# Patient Record
Sex: Female | Born: 1975 | Race: White | Hispanic: No | Marital: Single | Smoking: Never smoker
Health system: Southern US, Community
[De-identification: ages and names within clinical notes are randomized; demographics above are authoritative.]

## PROBLEM LIST (undated history)

## (undated) DIAGNOSIS — D649 Anemia, unspecified: Secondary | ICD-10-CM

## (undated) DIAGNOSIS — M009 Pyogenic arthritis, unspecified: Secondary | ICD-10-CM

## (undated) DIAGNOSIS — I4891 Unspecified atrial fibrillation: Secondary | ICD-10-CM

## (undated) DIAGNOSIS — F32A Depression, unspecified: Secondary | ICD-10-CM

## (undated) DIAGNOSIS — G4700 Insomnia, unspecified: Secondary | ICD-10-CM

## (undated) DIAGNOSIS — F4310 Post-traumatic stress disorder, unspecified: Secondary | ICD-10-CM

## (undated) DIAGNOSIS — F419 Anxiety disorder, unspecified: Secondary | ICD-10-CM

## (undated) DIAGNOSIS — I38 Endocarditis, valve unspecified: Secondary | ICD-10-CM

## (undated) DIAGNOSIS — R011 Cardiac murmur, unspecified: Secondary | ICD-10-CM

## (undated) DIAGNOSIS — I76 Septic arterial embolism: Secondary | ICD-10-CM

## (undated) DIAGNOSIS — N83209 Unspecified ovarian cyst, unspecified side: Secondary | ICD-10-CM

## (undated) DIAGNOSIS — I2699 Other pulmonary embolism without acute cor pulmonale: Secondary | ICD-10-CM

## (undated) DIAGNOSIS — R87629 Unspecified abnormal cytological findings in specimens from vagina: Secondary | ICD-10-CM

## (undated) DIAGNOSIS — F99 Mental disorder, not otherwise specified: Secondary | ICD-10-CM

## (undated) DIAGNOSIS — I471 Supraventricular tachycardia, unspecified: Secondary | ICD-10-CM

## (undated) DIAGNOSIS — A419 Sepsis, unspecified organism: Secondary | ICD-10-CM

## (undated) DIAGNOSIS — F1010 Alcohol abuse, uncomplicated: Secondary | ICD-10-CM

## (undated) DIAGNOSIS — J189 Pneumonia, unspecified organism: Secondary | ICD-10-CM

## (undated) DIAGNOSIS — I079 Rheumatic tricuspid valve disease, unspecified: Secondary | ICD-10-CM

## (undated) DIAGNOSIS — N649 Disorder of breast, unspecified: Secondary | ICD-10-CM

## (undated) HISTORY — DX: Anxiety disorder, unspecified: F41.9

## (undated) HISTORY — DX: Supraventricular tachycardia, unspecified: I47.10

## (undated) HISTORY — DX: Sepsis, unspecified organism: A41.9

## (undated) HISTORY — PX: CARDIAC ELECTROPHYSIOLOGY MAPPING AND ABLATION: SHX1292

## (undated) HISTORY — PX: CHOLECYSTECTOMY: SHX55

## (undated) HISTORY — PX: TOTAL HIP ARTHROPLASTY: SHX124

## (undated) HISTORY — DX: Unspecified ovarian cyst, unspecified side: N83.209

## (undated) HISTORY — PX: TRICUSPID VALVE REPLACEMENT: SHX816

## (undated) HISTORY — DX: Post-traumatic stress disorder, unspecified: F43.10

## (undated) HISTORY — DX: Mental disorder, not otherwise specified: F99

## (undated) HISTORY — DX: Supraventricular tachycardia: I47.1

## (undated) HISTORY — PX: CARDIAC ELECTROPHYSIOLOGY STUDY AND ABLATION: SHX1294

## (undated) HISTORY — PX: CARPAL TUNNEL RELEASE: SHX101

## (undated) HISTORY — PX: CARDIOVERSION: SHX1299

## (undated) HISTORY — DX: Anemia, unspecified: D64.9

## (undated) HISTORY — DX: Endocarditis, valve unspecified: I38

## (undated) HISTORY — DX: Unspecified abnormal cytological findings in specimens from vagina: R87.629

## (undated) HISTORY — DX: Disorder of breast, unspecified: N64.9

## (undated) HISTORY — PX: HIP SURGERY: SHX245

## (undated) HISTORY — DX: Rheumatic tricuspid valve disease, unspecified: I07.9

## (undated) HISTORY — DX: Depression, unspecified: F32.A

## (undated) HISTORY — DX: Insomnia, unspecified: G47.00

## (undated) HISTORY — DX: Unspecified atrial fibrillation: I48.91

## (undated) HISTORY — PX: THORACENTESIS: SHX235

---

## 1998-04-20 ENCOUNTER — Emergency Department (HOSPITAL_COMMUNITY): Admission: EM | Admit: 1998-04-20 | Discharge: 1998-04-20 | Payer: Self-pay | Admitting: Emergency Medicine

## 1998-09-22 ENCOUNTER — Emergency Department (HOSPITAL_COMMUNITY): Admission: EM | Admit: 1998-09-22 | Discharge: 1998-09-22 | Payer: Self-pay

## 2013-09-26 DIAGNOSIS — D649 Anemia, unspecified: Secondary | ICD-10-CM | POA: Insufficient documentation

## 2016-12-09 ENCOUNTER — Encounter: Payer: Self-pay | Admitting: Obstetrics & Gynecology

## 2016-12-23 ENCOUNTER — Encounter: Payer: Self-pay | Admitting: Obstetrics & Gynecology

## 2017-01-08 ENCOUNTER — Encounter: Payer: Self-pay | Admitting: Obstetrics & Gynecology

## 2017-01-08 ENCOUNTER — Ambulatory Visit (INDEPENDENT_AMBULATORY_CARE_PROVIDER_SITE_OTHER): Payer: Commercial Managed Care - PPO | Admitting: Obstetrics & Gynecology

## 2017-01-08 VITALS — BP 121/86 | HR 91 | Ht 68.0 in | Wt 221.0 lb

## 2017-01-08 DIAGNOSIS — N920 Excessive and frequent menstruation with regular cycle: Secondary | ICD-10-CM

## 2017-01-08 DIAGNOSIS — Z01419 Encounter for gynecological examination (general) (routine) without abnormal findings: Secondary | ICD-10-CM

## 2017-01-08 MED ORDER — NORGESTREL-ETHINYL ESTRADIOL 0.3-30 MG-MCG PO TABS: 1.0000 | ORAL_TABLET | Freq: Every day | ORAL | 11 refills | Status: DC

## 2017-01-08 NOTE — Progress Notes (Signed)
Subjective:    Judy Welch is a 41 yo S W P0 here today with the issues of very painful periods, very heavy periods, lasting 7 days. She takes Aleve and flexeril with help sometimes. This has been present for years but worse in the last year. She has taken OCPs for this in the past which helped the issue.   The patient is not currently sexually active. GYN screening history: last pap: was normal. The patient wears seatbelts: yes. The patient participates in regular exercise: not asked. Has the patient ever been transfused or tattooed?: yes. The patient reports that there is not domestic violence in her life.   Menstrual History: OB History    Gravida Para Term Preterm AB Living   0 0 0 0 0 0   SAB TAB Ectopic Multiple Live Births   0 0 0 0 0      Menarche age: 83 No LMP recorded. Period Cycle (Days): 28 Period Duration (Days): 3-4 days Period Pattern: (!) Irregular Menstrual Flow: Moderate, Heavy Menstrual Control: Maxi pad Dysmenorrhea: (!) Moderate Dysmenorrhea Symptoms: Cramping, Headache, Throbbing, Other (Comment)  The following portions of the patient's history were reviewed and updated as appropriate: allergies, current medications, past family history, past medical history, past social history, past surgical history and problem list.  Review of Systems Pertinent items are noted in HPI.    Objective:    BP 121/86 (BP Location: Right Arm, Patient Position: Sitting)   Pulse 91   Ht '5\' 8"'$  (1.727 m)   Wt 221 lb (100.2 kg)   BMI 33.60 kg/m   General Appearance:    Alert, cooperative, no distress, appears stated age  Head:    Normocephalic, without obvious abnormality, atraumatic  Eyes:    PERRL, conjunctiva/corneas clear, EOM's intact, fundi    benign, both eyes  Ears:    Normal TM's and external ear canals, both ears  Nose:   Nares normal, septum midline, mucosa normal, no drainage    or sinus tenderness  Throat:   Lips, mucosa, and tongue normal; teeth and gums normal   Neck:   Supple, symmetrical, trachea midline, no adenopathy;    thyroid:  no enlargement/tenderness/nodules; no carotid   bruit or JVD  Back:     Symmetric, no curvature, ROM normal, no CVA tenderness  Lungs:     Clear to auscultation bilaterally, respirations unlabored  Chest Wall:    No tenderness or deformity   Heart:    Regular rate and rhythm, S1 and S2 normal, no murmur, rub   or gallop  Breast Exam:    No tenderness, masses, or nipple abnormality  Abdomen:     Soft, non-tender, bowel sounds active all four quadrants,    no masses, no organomegaly  Genitalia:    Normal female without lesion, discharge or tenderness, NSSA, NT, mobile, nulliparous cervix, stenotic, NSSA, NT, no palpable adnexal masses     Extremities:   Extremities normal, atraumatic, no cyanosis or edema  Pulses:   2+ and symmetric all extremities  Skin:   Skin color, texture, turgor normal, no rashes or lesions  Lymph nodes:   Cervical, supraclavicular, and axillary nodes normal  Neurologic:   CNII-XII intact, normal strength, sensation and reflexes    throughout  .    Assessment:    Healthy female exam.    Plan:    + FH of breast cancer- check BRCA testing Heavy,painful periods- cbc, tsh, gyn u/s Preventative care- pap smear with cotesting today Start  lo ovral with NMP RTC 3 weeks

## 2017-01-08 NOTE — Progress Notes (Signed)
   Subjective:    Patient ID: Judy Welch, female    DOB: 02-11-1976, 41 y.o.   MRN: [id number]  HPI 41 yo S W P0 here today with the issues of very painful periods, very heavy periods, lasting 7 days. She takes Aleve and flexeril with help sometimes. This has been present for years but worse in the last year. She has taken OCPs for this in the past which helped the issue.     Review of Systems Her last pap was about 14 years ago She is a Marine scientist at [employer], previously in [place]. Mammogram last month FH- + breast-p cousin and p aunt, p great aunt + cervical cancer - mgreat aunt' + bladder cancer - mom    Objective:   Physical Exam        Assessment & Plan:  + FH of breast cancer- check BRCA testing Heavy,painful periods- cbc, tsh, gyn u/s Preventative care- pap smear with cotesting today Start lo ovral with NMP RTC 3 weeks

## 2017-01-09 ENCOUNTER — Ambulatory Visit (HOSPITAL_BASED_OUTPATIENT_CLINIC_OR_DEPARTMENT_OTHER)
Admission: RE | Admit: 2017-01-09 | Discharge: 2017-01-09 | Disposition: A | Discharge status: Home / Self Care | Payer: Commercial Managed Care - PPO | Source: Ambulatory Visit | Attending: Obstetrics & Gynecology | Admitting: Obstetrics & Gynecology

## 2017-01-09 ENCOUNTER — Encounter (HOSPITAL_BASED_OUTPATIENT_CLINIC_OR_DEPARTMENT_OTHER): Payer: Self-pay | Admitting: Radiology

## 2017-01-09 DIAGNOSIS — N920 Excessive and frequent menstruation with regular cycle: Secondary | ICD-10-CM

## 2017-01-12 LAB — CYTOLOGY - PAP
DIAGNOSIS: NEGATIVE
HPV: NOT DETECTED

## 2017-01-26 ENCOUNTER — Encounter: Payer: Self-pay | Admitting: *Deleted

## 2017-01-26 ENCOUNTER — Telehealth: Payer: Self-pay | Admitting: *Deleted

## 2017-01-26 NOTE — Telephone Encounter (Signed)
Pt notified of [facility] My Risk report.  Copy of report mailed to pt and pt will call the genetic counselor to discuss the report in detail.  Pt also notified of pap and U/S report.  Dr Hulan Fray reviewed the My Risk results and agreed that pt needs to talk with the genetic counselor.

## 2017-04-29 DIAGNOSIS — G621 Alcoholic polyneuropathy: Secondary | ICD-10-CM | POA: Insufficient documentation

## 2017-07-09 ENCOUNTER — Encounter: Payer: Self-pay | Admitting: Obstetrics & Gynecology

## 2017-07-09 ENCOUNTER — Ambulatory Visit (INDEPENDENT_AMBULATORY_CARE_PROVIDER_SITE_OTHER): Payer: PRIVATE HEALTH INSURANCE | Admitting: Obstetrics & Gynecology

## 2017-07-09 VITALS — BP 112/79 | HR 79 | Ht 68.0 in | Wt 234.0 lb

## 2017-07-09 DIAGNOSIS — R339 Retention of urine, unspecified: Secondary | ICD-10-CM | POA: Diagnosis not present

## 2017-07-09 NOTE — Progress Notes (Signed)
Patient ID: Judy Welch, female   DOB: 10/16/1975, 41 y.o.   MRN: [id number]  No chief complaint on file.   HPI Judy Welch is a 41 y.o. female. P0 here with new onset feeling of some pressure in her vagina. She was worried that her uterus had dropped. She denies GSUI and has some urinary incontinence. HPI  Past Medical History:  Diagnosis Date  . Anemia   . Breast disorder    left breast lump  . Mental disorder    anxiety  . Ovarian cyst   . SVT (supraventricular tachycardia) ([place])   . Vaginal Pap smear, abnormal     Past Surgical History:  Procedure Laterality Date  . CARDIAC ELECTROPHYSIOLOGY STUDY AND ABLATION    . CARDIOVERSION      Family History  Problem Relation Age of Onset  . Diabetes Father   . Hypertension Father   . Cancer Mother        bladder  . Cancer Maternal Aunt        breast  . Stroke Neg Hx     Social History Social History   Tobacco Use  . Smoking status: Never Smoker  . Smokeless tobacco: Never Used  Substance Use Topics  . Alcohol use: No  . Drug use: No    No Known Allergies  Current Outpatient Medications  Medication Sig Dispense Refill  . acamprosate (CAMPRAL) 333 MG tablet Take 666 mg by mouth.    . cyclobenzaprine (FLEXERIL) 10 MG tablet Take by mouth.    . hydrOXYzine (VISTARIL) 100 MG capsule Take by mouth.    . norgestrel-ethinyl estradiol (LO/OVRAL,CRYSELLE) 0.3-30 MG-MCG tablet Take 1 tablet by mouth daily. 1 Package 11  . potassium chloride 20 MEQ/15ML (10%) SOLN Take by mouth.    . propranolol (INDERAL) 10 MG tablet Take by mouth.    . sertraline (ZOLOFT) 100 MG tablet Take by mouth.    . clonazePAM (KLONOPIN) 0.5 MG tablet Take by mouth.    . traZODone (DESYREL) 50 MG tablet Take by mouth.     No current facility-administered medications for this visit.     Review of Systems Review of Systems She is a Marine scientist at [employer] Blood pressure 112/79, pulse 79, height 5\' 8"  (1.727 m), weight 234 lb (106.1 kg), last  menstrual period 05/28/2017.  Physical Exam Physical Exam Well nourished, well hydrated White female, no apparent distress Breathing, conversing, and ambulating normally Abd- benign Grade "1/2" cystocele. NO uterine prolapse, NO rectocele  Data Reviewed FINDINGS: Uterus  Measurements: 7.9 x 4.2 x 4.7 cm. No fibroids or other mass visualized. Status:  Edited Result - FINAL Visible to patient:  No (Not Released) Next appt:  None Dx:  Well woman exam with routine gynecolo...  Component 38mo ago  Adequacy Satisfactory for evaluation endocervical/transformation zone component PRESENT.   Diagnosis NEGATIVE FOR INTRAEPITHELIAL LESIONS OR MALIGNANCY.   HPV NOT DETECTED   Comment: Normal Reference Range -          Assessment    Urinary retention    Plan   Refer to urology        Emily Filbert 07/09/2017, 3:39 PM

## 2017-08-19 DIAGNOSIS — M533 Sacrococcygeal disorders, not elsewhere classified: Secondary | ICD-10-CM | POA: Insufficient documentation

## 2018-02-23 ENCOUNTER — Other Ambulatory Visit: Payer: Self-pay | Admitting: Obstetrics & Gynecology

## 2018-03-08 DIAGNOSIS — G894 Chronic pain syndrome: Secondary | ICD-10-CM | POA: Insufficient documentation

## 2018-04-09 IMAGING — US US PELVIS COMPLETE
1 series · 13 of 25 positions shown · non-contrast
Comparison: None

CLINICAL DATA: Patient with history of menorrhagia. Bilateral
pelvic pain.



[Series 1: us pelvis complete · 0.20mm/px · 13 of 82 slices shown]
[im 1/82]
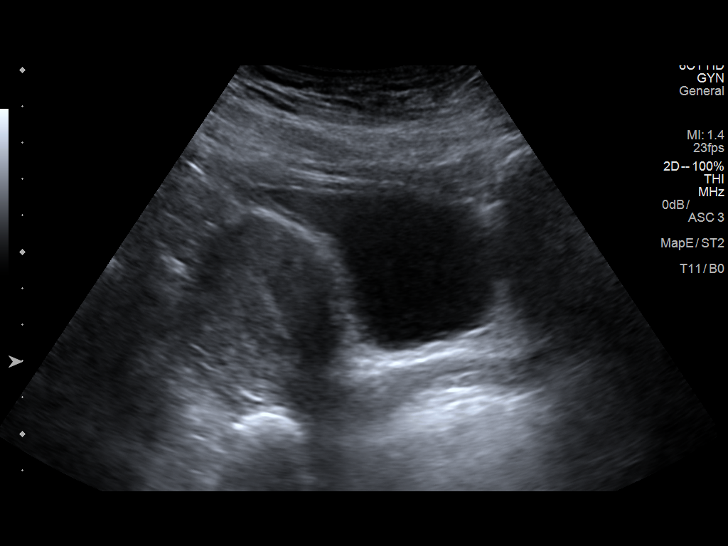
[im 7/82]
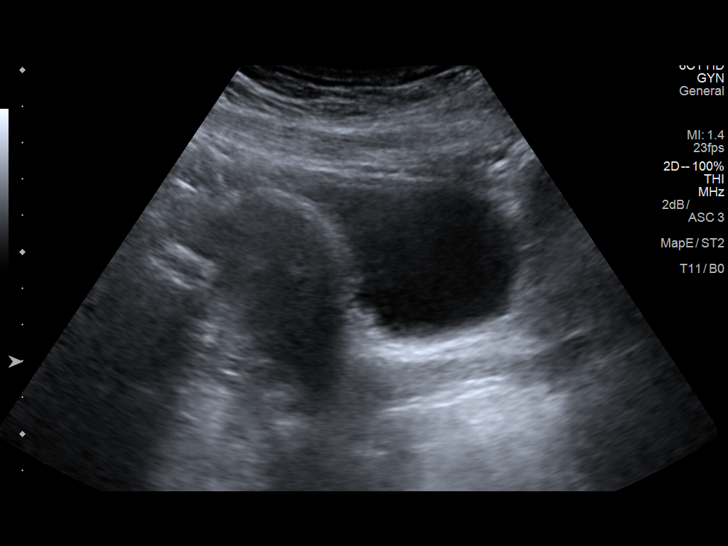
[im 14/82]
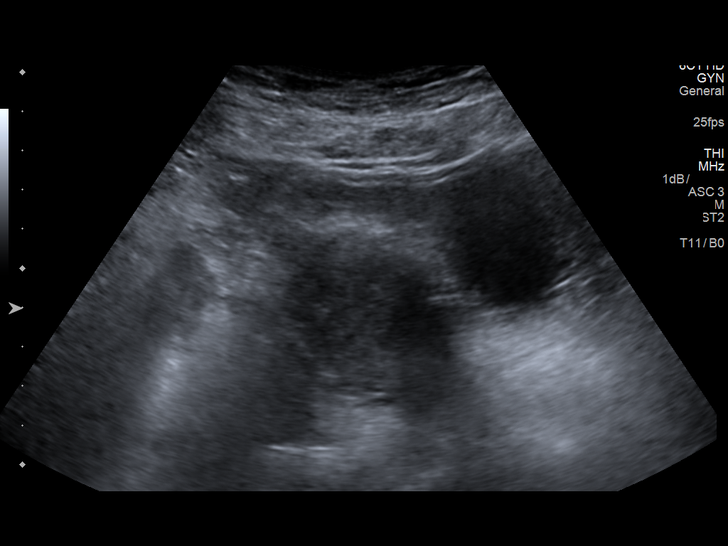
[im 21/82]
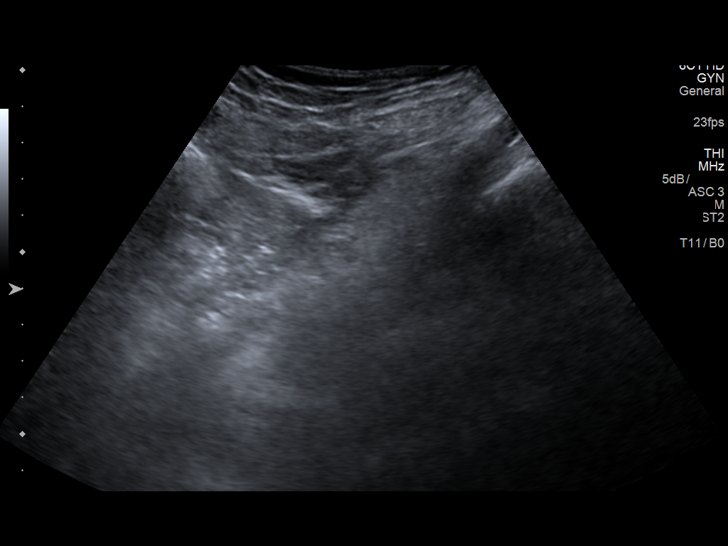
[im 28/82]
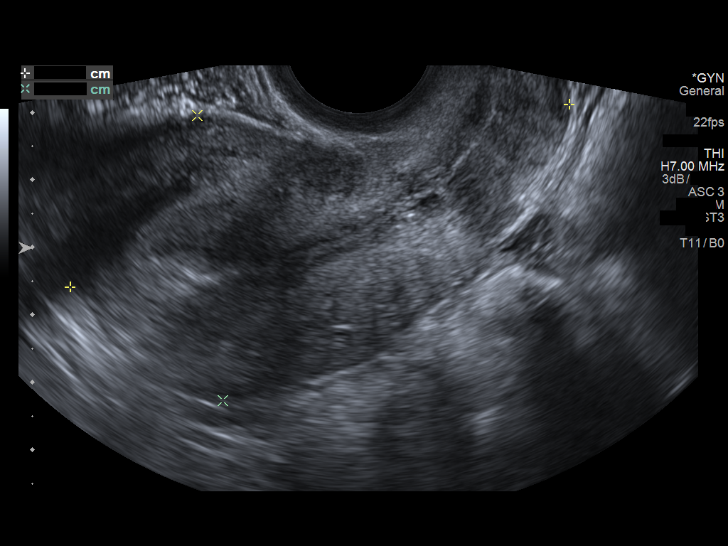
[im 34/82]
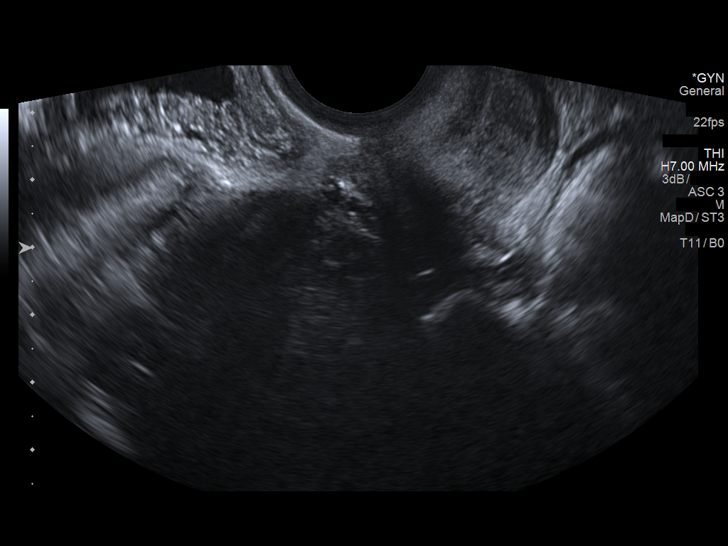
[im 41/82]
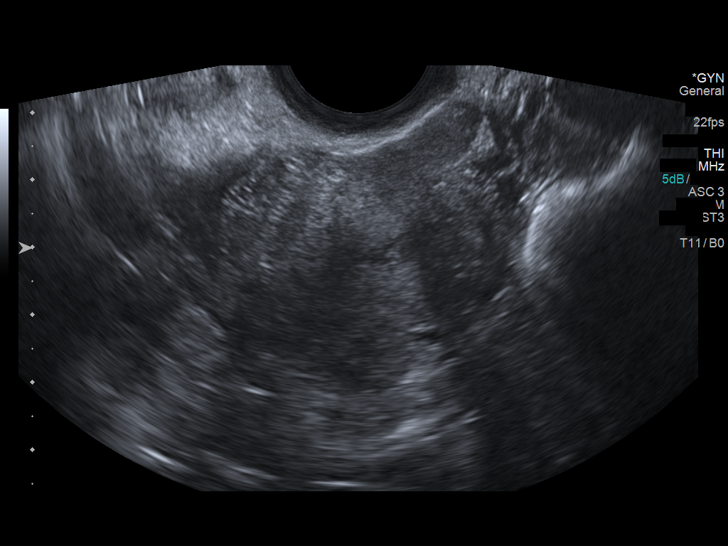
[im 48/82]
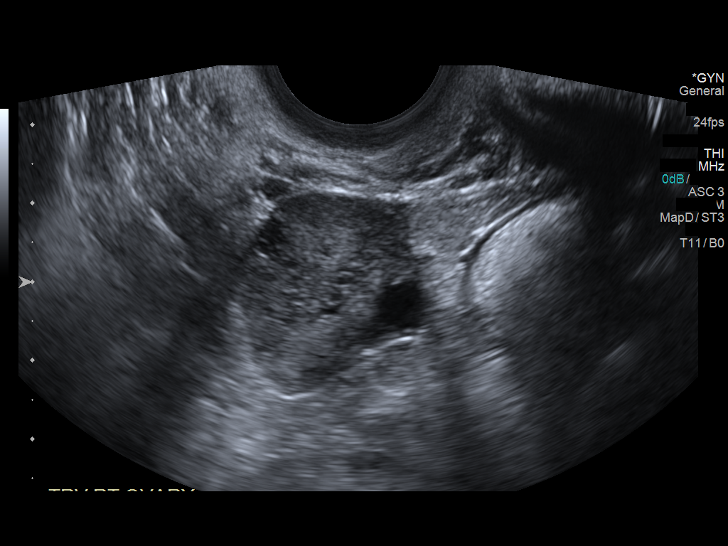
[im 55/82]
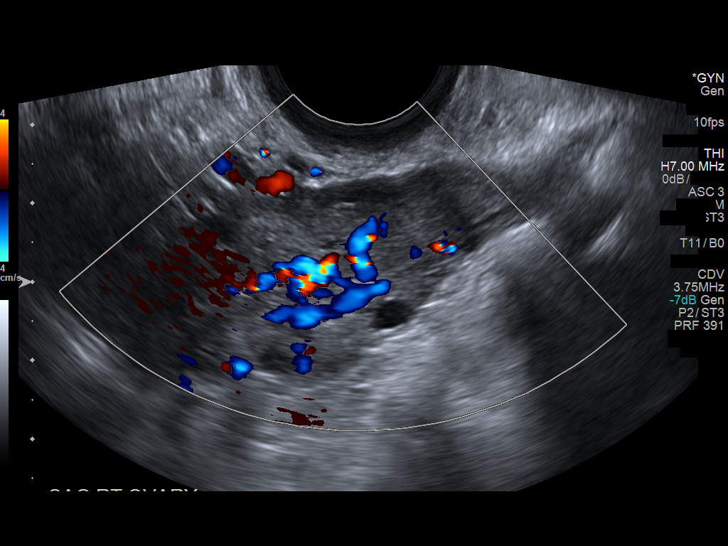
[im 61/82]
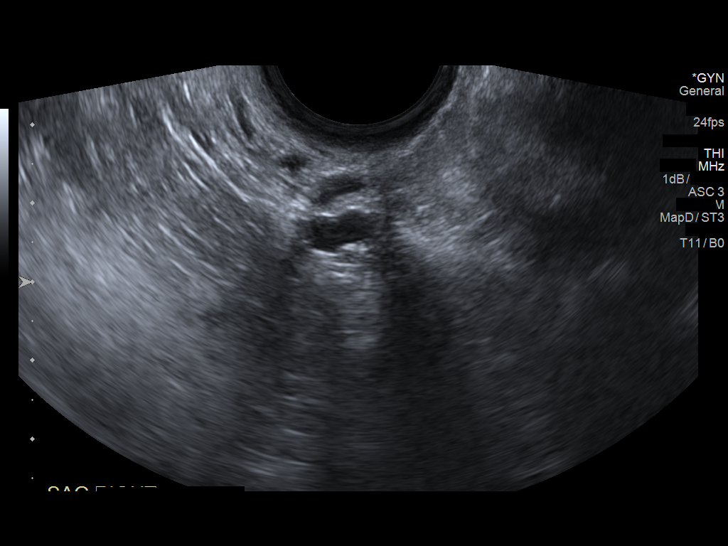
[im 68/82]
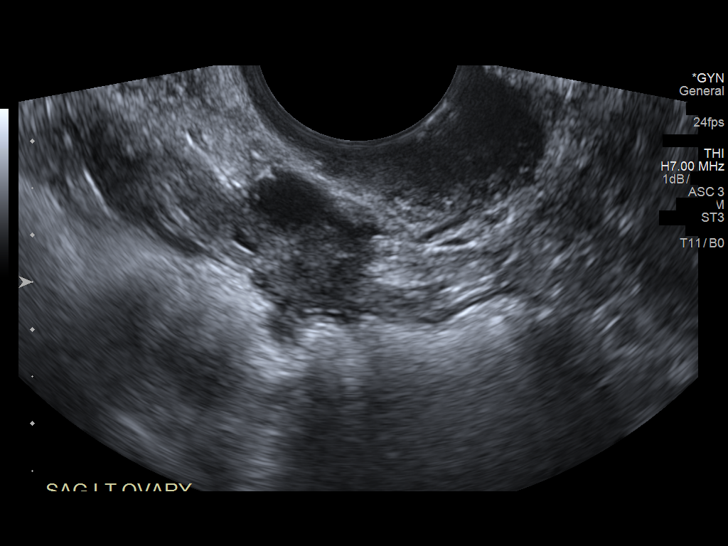
[im 75/82]
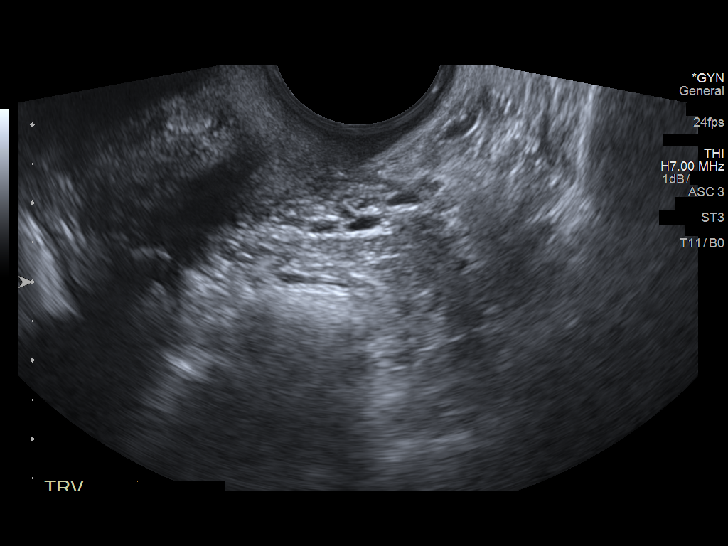
[im 82/82]
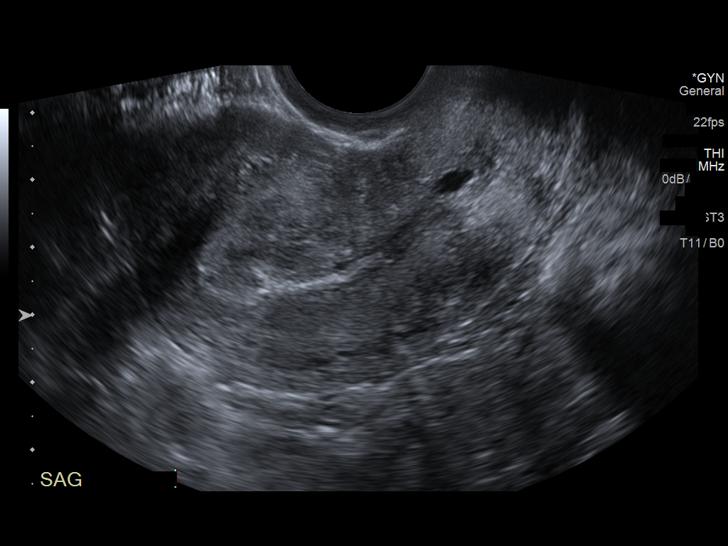

[13 of 25 positions shown; findings below may reference images not displayed]

FINDINGS: Uterus

Measurements: 7.9 x 4.2 x 4.7 cm. No fibroids or other mass
visualized.

Endometrium

Thickness: 13 mm.  No focal abnormality visualized.

Right ovary

Measurements: 3.4 x 2.4 x 2.4 cm. Normal appearance/no adnexal mass.

Left ovary

Measurements: 3.0 x 1.7 x 1.9 cm. Normal appearance/no adnexal mass.

Other findings

No abnormal free fluid.
IMPRESSION: Endometrium measures 13 mm. If bleeding remains unresponsive to
hormonal or medical therapy, sonohysterogram should be considered
for focal lesion work-up. (Ref: Radiological Reasoning: Algorithmic
Workup of Abnormal Vaginal Bleeding with Endovaginal Sonography and
Sonohysterography. AJR [id number]; 191:[id number])

## 2018-08-03 ENCOUNTER — Other Ambulatory Visit: Payer: Self-pay | Admitting: Obstetrics & Gynecology

## 2018-08-04 ENCOUNTER — Other Ambulatory Visit: Payer: Self-pay | Admitting: Obstetrics & Gynecology

## 2018-09-14 ENCOUNTER — Ambulatory Visit: Payer: PRIVATE HEALTH INSURANCE | Admitting: Psychiatry

## 2019-04-27 ENCOUNTER — Telehealth (HOSPITAL_COMMUNITY): Payer: Self-pay

## 2019-04-27 NOTE — Telephone Encounter (Signed)
Telephoned patient at home number.  Left message to call [facility] and schedule appointment.

## 2019-06-07 DIAGNOSIS — I480 Paroxysmal atrial fibrillation: Secondary | ICD-10-CM | POA: Insufficient documentation

## 2019-06-07 DIAGNOSIS — I2699 Other pulmonary embolism without acute cor pulmonale: Secondary | ICD-10-CM | POA: Insufficient documentation

## 2019-06-08 DIAGNOSIS — D509 Iron deficiency anemia, unspecified: Secondary | ICD-10-CM | POA: Insufficient documentation

## 2019-06-08 DIAGNOSIS — E876 Hypokalemia: Secondary | ICD-10-CM | POA: Insufficient documentation

## 2019-07-02 DIAGNOSIS — Z87898 Personal history of other specified conditions: Secondary | ICD-10-CM | POA: Insufficient documentation

## 2019-08-08 DIAGNOSIS — I079 Rheumatic tricuspid valve disease, unspecified: Secondary | ICD-10-CM | POA: Insufficient documentation

## 2019-10-04 DIAGNOSIS — Z96649 Presence of unspecified artificial hip joint: Secondary | ICD-10-CM | POA: Insufficient documentation

## 2020-05-03 DIAGNOSIS — M1611 Unilateral primary osteoarthritis, right hip: Secondary | ICD-10-CM | POA: Insufficient documentation

## 2020-11-13 DIAGNOSIS — F5101 Primary insomnia: Secondary | ICD-10-CM | POA: Insufficient documentation

## 2020-11-13 DIAGNOSIS — F331 Major depressive disorder, recurrent, moderate: Secondary | ICD-10-CM | POA: Insufficient documentation

## 2020-11-13 DIAGNOSIS — F410 Panic disorder [episodic paroxysmal anxiety] without agoraphobia: Secondary | ICD-10-CM | POA: Insufficient documentation

## 2020-11-14 ENCOUNTER — Encounter: Payer: Self-pay | Admitting: Obstetrics and Gynecology

## 2020-11-14 ENCOUNTER — Other Ambulatory Visit: Payer: Self-pay

## 2020-11-14 ENCOUNTER — Other Ambulatory Visit (HOSPITAL_COMMUNITY)
Admission: RE | Admit: 2020-11-14 | Discharge: 2020-11-14 | Disposition: A | Discharge status: Home / Self Care | Payer: Medicaid Other | Source: Ambulatory Visit | Attending: Obstetrics and Gynecology | Admitting: Obstetrics and Gynecology

## 2020-11-14 ENCOUNTER — Ambulatory Visit (INDEPENDENT_AMBULATORY_CARE_PROVIDER_SITE_OTHER): Payer: Medicaid Other | Admitting: Obstetrics and Gynecology

## 2020-11-14 VITALS — BP 132/88 | HR 93 | Resp 16 | Ht 68.0 in | Wt 214.0 lb

## 2020-11-14 DIAGNOSIS — N939 Abnormal uterine and vaginal bleeding, unspecified: Secondary | ICD-10-CM | POA: Diagnosis not present

## 2020-11-14 DIAGNOSIS — Z01419 Encounter for gynecological examination (general) (routine) without abnormal findings: Secondary | ICD-10-CM

## 2020-11-14 NOTE — Progress Notes (Signed)
GYNECOLOGY ANNUAL PREVENTATIVE CARE ENCOUNTER NOTE  History:     Judy Welch is a 45 y.o. G0P0000 female here for a routine annual gynecologic exam.  Current complaints: Heavy, painful periods. Last [place] was 2018 which showed a mildly thickened endometrium. .   Denies abnormal vaginal bleeding, discharge, pelvic pain, problems with intercourse or other gynecologic concerns.   Spent 2 months in the hospital with Sepsis. Hx of PE on xerolto, has terribly heavy/painful periods for many of years; worse now that she is on [place]. Hx of endocarditis and Afib. Periods are a disruption to her life. They are regular however painful/heavy.   Gynecologic History Patient's last menstrual period was 10/24/2020. Contraception: none Last Pap: 2018. Results were: normal with negative HPV Last mammogram: NA  Obstetric History OB History  Gravida Para Term Preterm AB Living  0 0 0 0 0 0  SAB IAB Ectopic Multiple Live Births  0 0 0 0 0    Past Medical History:  Diagnosis Date  . Anemia   . Anxiety   . Breast disorder    left breast lump  . Depression   . Endocarditis   . Fibrillation, atrial ([place])   . Insomnia disorder   . Mental disorder    anxiety  . Ovarian cyst   . PTSD (post-traumatic stress disorder)   . Septic shock ([place])   . SVT (supraventricular tachycardia) ([place])   . Tricuspid valve disorder   . Vaginal Pap smear, abnormal     Past Surgical History:  Procedure Laterality Date  . CARDIAC ELECTROPHYSIOLOGY MAPPING AND ABLATION    . CARDIAC ELECTROPHYSIOLOGY STUDY AND ABLATION    . CARDIOVERSION    . HIP SURGERY      Current Outpatient Medications on File Prior to Visit  Medication Sig Dispense Refill  . cyclobenzaprine (FLEXERIL) 10 MG tablet Take by mouth.    . dofetilide (TIKOSYN) 250 MCG capsule Take by mouth.    . hydrOXYzine (VISTARIL) 100 MG capsule Take by mouth.    . sertraline (ZOLOFT) 100 MG tablet Take by mouth.    Marland Kitchen acamprosate (CAMPRAL) 333 MG tablet  Take 666 mg by mouth.    . clonazePAM (KLONOPIN) 0.5 MG tablet Take by mouth.    . CRYSELLE-28 0.3-30 MG-MCG tablet TAKE BY MOUTH AS DIRECTED 168 tablet 0  . potassium chloride 20 MEQ/15ML (10%) SOLN Take by mouth.    . propranolol (INDERAL) 10 MG tablet Take by mouth.    . traZODone (DESYREL) 50 MG tablet Take by mouth.     No current facility-administered medications on file prior to visit.    Allergies  Allergen Reactions  . Azithromycin Other (See Comments)    Can not take with sotalol Can not take with sotalol Can not take with sotalol Can not take with sotalol     Social History:  reports that she has never smoked. She has never used smokeless tobacco. She reports that she does not drink alcohol and does not use drugs.  Family History  Problem Relation Age of Onset  . Diabetes Father   . Hypertension Father   . Cancer Mother        bladder  . Cancer Maternal Aunt        breast  . Stroke Neg Hx     The following portions of the patient's history were reviewed and updated as appropriate: allergies, current medications, past family history, past medical history, past social history, past surgical history and  problem list.  Review of Systems Pertinent items noted in HPI and remainder of comprehensive ROS otherwise negative.  Physical Exam:  BP 132/88   Pulse 93   Resp 16   Ht 5\' 8"  (1.727 m)   Wt 214 lb (97.1 kg)   LMP 10/24/2020   BMI 32.54 kg/m  CONSTITUTIONAL: Well-developed, well-nourished female in no acute distress.  HENT:  Normocephalic, atraumatic, External right and left ear normal.  EYES: Conjunctivae and EOM are normal. Pupils are equal, round, and reactive to light. No scleral icterus.  NECK: Normal range of motion, supple, no masses.  Normal thyroid.  SKIN: Skin is warm and dry. No rash noted. Not diaphoretic. No erythema. No pallor. MUSCULOSKELETAL: Normal range of motion. No tenderness.  No cyanosis, clubbing, or edema. NEUROLOGIC: Alert and  oriented to person, place, and time. Normal reflexes, muscle tone coordination.  PSYCHIATRIC: Normal mood and affect. Normal behavior. Normal judgment and thought content. CARDIOVASCULAR: Normal heart rate noted, regular rhythm RESPIRATORY: Clear to auscultation bilaterally. Effort and breath sounds normal, no problems with respiration noted. BREASTS: Symmetric in size. No masses, tenderness, skin changes, nipple drainage, or lymphadenopathy bilaterally. Performed in the presence of a chaperone. ABDOMEN: Soft, no distention noted.  No tenderness, rebound or guarding.  PELVIC: Normal appearing external genitalia and urethral meatus; normal appearing vaginal mucosa and cervix.  No abnormal discharge noted.  Pap smear obtained.  Normal uterine size, no other palpable masses, no uterine or adnexal tenderness.  Performed in the presence of a chaperone.   Assessment and Plan:    1. Abnormal vaginal bleeding  MD visit to discuss options. ? Safety of progesterone use given that she is on Xerolto, she is not all that comfortable using progesterone given her history MD visit to discuss whether she is a candidate for ablation or other surgical options for bleeding.   2. Women's annual routine gynecological examination  - Cytology - PAP( [place]) - MM Digital Screening; Future   Zayana Salvador, Artist Pais, NP [place]

## 2020-11-14 NOTE — Progress Notes (Signed)
error 

## 2020-11-15 LAB — CYTOLOGY - PAP
Comment: NEGATIVE
Diagnosis: NEGATIVE
High risk HPV: NEGATIVE

## 2020-11-22 ENCOUNTER — Telehealth: Payer: Self-pay | Admitting: Radiology

## 2020-12-06 ENCOUNTER — Encounter: Payer: Self-pay | Admitting: Obstetrics and Gynecology

## 2020-12-06 ENCOUNTER — Ambulatory Visit (INDEPENDENT_AMBULATORY_CARE_PROVIDER_SITE_OTHER): Payer: Self-pay | Admitting: Obstetrics and Gynecology

## 2020-12-06 ENCOUNTER — Other Ambulatory Visit (HOSPITAL_COMMUNITY)
Admission: RE | Admit: 2020-12-06 | Discharge: 2020-12-06 | Disposition: A | Discharge status: Home / Self Care | Payer: Medicaid Other | Source: Ambulatory Visit | Attending: Obstetrics and Gynecology | Admitting: Obstetrics and Gynecology

## 2020-12-06 ENCOUNTER — Other Ambulatory Visit: Payer: Self-pay

## 2020-12-06 VITALS — BP 128/86 | HR 87 | Ht 68.0 in | Wt 214.0 lb

## 2020-12-06 DIAGNOSIS — D261 Other benign neoplasm of corpus uteri: Secondary | ICD-10-CM | POA: Insufficient documentation

## 2020-12-06 DIAGNOSIS — N939 Abnormal uterine and vaginal bleeding, unspecified: Secondary | ICD-10-CM | POA: Diagnosis not present

## 2020-12-06 LAB — POCT URINE PREGNANCY: Preg Test, Ur: NEGATIVE

## 2020-12-06 NOTE — Progress Notes (Signed)
45 yo P0 presenting for evaluation of AUB. Patient reports a history of heavy monthly cycles medically managed with contraception pills. Patient was recently hospitalized for 47 days with septic endocarditis and pulmonary emboli. She is on lifelong anticoagulation with Xarelto. Patient reports that since her discontinuation of hormonal contraception and initiation of Xarelto, her monthly period is heavier than ever with the passage of large clots associated with severe dysmenorrhea. Patient is interested in a hysterectomy for definitive treatment. She is fearful of hormonal medical management  Past Medical History:  Diagnosis Date  . Anemia   . Anxiety   . Breast disorder    left breast lump  . Depression   . Endocarditis   . Fibrillation, atrial ([place])   . Insomnia disorder   . Mental disorder    anxiety  . Ovarian cyst   . PTSD (post-traumatic stress disorder)   . Septic shock ([place])   . SVT (supraventricular tachycardia) ([place])   . Tricuspid valve disorder   . Vaginal Pap smear, abnormal    Past Surgical History:  Procedure Laterality Date  . CARDIAC ELECTROPHYSIOLOGY MAPPING AND ABLATION    . CARDIAC ELECTROPHYSIOLOGY STUDY AND ABLATION    . CARDIOVERSION    . HIP SURGERY     Family History  Problem Relation Age of Onset  . Diabetes Father   . Hypertension Father   . Cancer Mother        bladder  . Cancer Maternal Aunt        breast  . Stroke Neg Hx    Social History   Tobacco Use  . Smoking status: Never Smoker  . Smokeless tobacco: Never Used  Substance Use Topics  . Alcohol use: No  . Drug use: No   ROS See pertinent in HPI. All other systems reviewed and non contributory Blood pressure 128/86, pulse 87, height 5\' 8"  (1.727 m), weight 214 lb (97.1 kg).  GENERAL: Well-developed, well-nourished female in no acute distress.  ABDOMEN: Soft, nontender, nondistended. No organomegaly. PELVIC: Normal external female genitalia. Vagina is pink and rugated.  Normal  discharge. Normal appearing cervix. Uterus is normal in size. No adnexal mass or tenderness. EXTREMITIES: No cyanosis, clubbing, or edema, 2+ distal pulses.  A/P 45 yo here for evaluation of AUB - Worsening AUB likely secondary to anticoagulated state - Discussed surgical management with endometrial ablation vs hysterectomy pending results of biopsy and pelvic ultrasound - Given small uterine size, patient may be a candidate for vaginal hysterectomy- will refer to my partners Dr. Kennon Rounds and Dr. Harolyn Rutherford for evaluation - Patient completed financial assistance packet - ENDOMETRIAL BIOPSY     The indications for endometrial biopsy were reviewed.   Risks of the biopsy including cramping, bleeding, infection, uterine perforation, inadequate specimen and need for additional procedures  were discussed. The patient states she understands and agrees to undergo procedure today. Consent was signed. Time out was performed. Urine HCG was negative. A sterile speculum was placed in the patient's vagina and the cervix was prepped with Betadine. A single-toothed tenaculum was placed on the anterior lip of the cervix to stabilize it. The uterine cavity was sounded to a depth of 7 cm using the uterine sound. The 3 mm pipelle was introduced into the endometrial cavity without difficulty, 2 passes were made.  A  moderate amount of tissue was  sent to pathology. The instruments were removed from the patient's vagina. Minimal bleeding from the cervix was noted. The patient tolerated the procedure well.  Routine  post-procedure instructions were given to the patient. The patient will follow up in two weeks to review the results and for further management.

## 2020-12-06 NOTE — Patient Instructions (Signed)
Vaginal Hysterectomy  A vaginal hysterectomy is a procedure to remove all or part of the uterus through a small incision in the vagina. In this procedure, your health care provider may remove your entire uterus, including the cervix. The cervix is the opening and bottom part of the uterus and is located between the vagina and the uterus. Sometimes, the ovaries and fallopian tubes are also removed. This surgery may be done to treat problems such as:  Noncancerous growths in the uterus (uterine fibroids) that cause symptoms.  A condition that causes the lining of the uterus to grow in other areas (endometriosis).  Problems with pelvic support.  Cancer of the cervix, ovaries, uterus, or tissue that lines the uterus (endometrium).  Excessive bleeding in the uterus. When removing your uterus, your health care provider may also remove the ovaries and the fallopian tubes. After this procedure, you will no longer be able to have a baby, and you will no longer have a menstrual period. Tell a health care provider about:  Any allergies you have.  All medicines you are taking, including vitamins, herbs, eye drops, creams, and over-the-counter medicines.  Any problems you or family members have had with anesthetic medicines.  Any blood disorders you have.  Any surgeries you have had.  Any medical conditions you have.  Whether you are pregnant or may be pregnant. What are the risks? Generally, this is a safe procedure. However, problems may occur, including:  Bleeding.  Infection.  Blood clots in the legs or lungs.  Damage to nearby structures or organs.  Pain during sex.  Allergic reactions to medicines. What happens before the procedure? Staying hydrated Follow instructions from your health care provider about hydration, which may include:  Up to 2 hours before the procedure - you may continue to drink clear liquids, such as water, clear fruit juice, black coffee, and plain tea.    Eating and drinking restrictions Follow instructions from your health care provider about eating and drinking, which may include:  8 hours before the procedure - stop eating heavy meals or foods, such as meat, fried foods, or fatty foods.  6 hours before the procedure - stop eating light meals or foods, such as toast or cereal.  6 hours before the procedure - stop drinking milk or drinks that contain milk.  2 hours before the procedure - stop drinking clear liquids. Medicines  Ask your health care provider about: ? Changing or stopping your regular medicines. This is especially important if you are taking diabetes medicines or blood thinners. ? Taking medicines such as aspirin and ibuprofen. These medicines can thin your blood. Do not take these medicines unless your health care provider tells you to take them. ? Taking over-the-counter medicines, vitamins, herbs, and supplements.  You may be asked to take a medicine to empty your colon (bowel preparation). General instructions  If you were asked to do a bowel preparation before the procedure, follow instructions from your health care provider.  This procedure can affect the way you feel about yourself. Talk with your health care provider about the physical and emotional changes hysterectomy may cause.  Do not use any products that contain nicotine or tobacco for at least 4 weeks before the procedure. These products include cigarettes, e-cigarettes, and chewing tobacco. If you need help quitting, ask your health care provider.  Plan to have a responsible adult take you home from the hospital or clinic.  Plan to have a responsible adult care for you for  the time you are told after you leave the hospital or clinic. This is important. Surgery safety Ask your health care provider:  How your surgery site will be marked.  What steps will be taken to help prevent infection. These may include: ? Removing hair at the surgery  site. ? Washing skin with a germ-killing soap. ? Receiving antibiotic medicine. What happens during the procedure?  An IV will be inserted into one of your veins.  You will be given one or more of the following: ? A medicine to help you relax (sedative). ? A medicine to numb the area (local anesthetic). ? A medicine to make you fall asleep (general anesthetic). ? A medicine that is injected into your spine to numb the area below and slightly above the injection site (spinal anesthetic). ? A medicine that is injected into an area of your body to numb everything below the injection site (regional anesthetic).  Your surgeon will make an incision in your vagina.  Your surgeon will locate and remove all or part of your uterus. Part or all of the uterus will be removed through the vagina.  Your ovaries and fallopian tubes may be removed at the same time.  The incision in your vagina will be closed with stitches (sutures) that dissolve over time. The procedure may vary among health care providers and hospitals. What happens after the procedure?  Your blood pressure, heart rate, breathing rate, and blood oxygen level will be monitored until you leave the hospital or clinic.  You will be encouraged to walk as soon as possible. You will also use a device or do breathing exercises to keep your lungs clear.  You may have to wear compression stockings. These stockings help to prevent blood clots and reduce swelling in your legs.  You will be given pain medicine as needed.  You will need to wear a sanitary pad for vaginal discharge or bleeding. Summary  A vaginal hysterectomy is a procedure to remove all or part of the uterus through the vagina.  You may need a vaginal hysterectomy to treat a variety of abnormalities of the uterus.  Plan to have a responsible adult take you home from the hospital or clinic.  Plan to have a responsible adult care for you for the time you are told after you  leave the hospital or clinic. This is important. This information is not intended to replace advice given to you by your health care provider. Make sure you discuss any questions you have with your health care provider. Document Revised: 04/13/2020 Document Reviewed: 04/13/2020 Elsevier Patient Education  2021 [place].   Endometrial Ablation Endometrial ablation is a procedure that destroys the thin inner layer of the lining of the uterus (endometrium). This procedure may be done:  To stop heavy menstrual periods.  To stop bleeding that is causing anemia.  To control irregular bleeding.  To treat bleeding caused by small tumors (fibroids) in the endometrium. This procedure is often done as an alternative to major surgery, such as removal of the uterus and cervix (hysterectomy). As a result of this procedure:  You may not be able to have children. However, if you have not yet gone through menopause: ? You may still have a small chance of getting pregnant. ? You will need to use a reliable method of birth control after the procedure to prevent pregnancy.  You may stop having a menstrual period, or you may have only a small amount of bleeding during your  period. Menstruation may return several years after the procedure. Tell a health care provider about:  Any allergies you have.  All medicines you are taking, including vitamins, herbs, eye drops, creams, and over-the-counter medicines.  Any problems you or family members have had with the use of anesthetic medicines.  Any blood disorders you have.  Any surgeries you have had.  Any medical conditions you have.  Whether you are pregnant or may be pregnant. What are the risks? Generally, this is a safe procedure. However, problems may occur, including:  A hole (perforation) in the uterus or bowel.  Infection in the uterus, bladder, or vagina.  Bleeding.  Allergic reaction to medicines.  Damage to nearby structures or  organs.  An air bubble in the lung (air embolus).  Problems with pregnancy.  Failure of the procedure.  Decreased ability to diagnose cancer in the endometrium. Scar tissue forms after the procedure, making it more difficult to get a sample of the uterine lining. What happens before the procedure? Medicines Ask your health care provider about:  Changing or stopping your regular medicines. This is especially important if you take diabetes medicines or blood thinners.  Taking medicines such as aspirin and ibuprofen. These medicines can thin your blood. Do not take these medicines before your procedure if your doctor tells you not to take them.  Taking over-the-counter medicines, vitamins, herbs, and supplements. Tests  You will have tests of your endometrium to make sure there are no precancerous cells or cancer cells present.  You may have an ultrasound of the uterus. General instructions  Do not use any products that contain nicotine or tobacco for at least 4 weeks before the procedure. These include cigarettes, chewing tobacco, and vaping devices, such as e-cigarettes. If you need help quitting, ask your health care provider.  You may be given medicines to thin the endometrium.  Ask your health care provider what steps will be taken to help prevent infection. These steps may include: ? Removing hair at the surgery site. ? Washing skin with a germ-killing soap. ? Taking antibiotic medicine.  Plan to have a responsible adult take you home from the hospital or clinic.  Plan to have a responsible adult care for you for the time you are told after you leave the hospital or clinic. This is important. What happens during the procedure?  You will lie on an exam table with your feet and legs supported as in a pelvic exam.  An IV will be inserted into one of your veins.  You will be given a medicine to help you relax (sedative).  A surgical tool with a light and camera  (resectoscope) will be inserted into your vagina and moved into your uterus. This allows your surgeon to see inside your uterus.  Endometrial tissue will be destroyed and removed, using one of the following methods: ? Radiofrequency. This uses an electrical current to destroy the endometrium. ? Cryotherapy. This uses extreme cold to freeze the endometrium. ? Heated fluid. This uses a heated salt and water (saline) solution to destroy the endometrium. ? Microwave. This uses high-energy microwaves to heat up the endometrium and destroy it. ? Thermal balloon. This involves inserting a catheter with a balloon tip into the uterus. The balloon tip is filled with heated fluid to destroy the endometrium. The procedure may vary among health care providers and hospitals.   What happens after the procedure?  Your blood pressure, heart rate, breathing rate, and blood oxygen level will be  monitored until you leave the hospital or clinic.  You may have vaginal bleeding for 4-6 weeks after the procedure. You may also have: ? Cramps. ? A thin, watery vaginal discharge that is light pink or brown. ? A need to urinate more than usual. ? Nausea.  If you were given a sedative during the procedure, it can affect you for several hours. Do not drive or operate machinery until your health care provider says that it is safe.  Do not have sex or insert anything into your vagina until your health care provider says it is safe. Summary  Endometrial ablation is done to treat many causes of heavy menstrual bleeding. The procedure destroys the thin inner layer of the lining of the uterus (endometrium).  This procedure is often done as an alternative to major surgery, such as removal of the uterus and cervix (hysterectomy).  Plan to have a responsible adult take you home from the hospital or clinic. This information is not intended to replace advice given to you by your health care provider. Make sure you discuss any  questions you have with your health care provider. Document Revised: 03/01/2020 Document Reviewed: 03/01/2020 Elsevier Patient Education  [place].

## 2020-12-10 LAB — SURGICAL PATHOLOGY

## 2020-12-11 ENCOUNTER — Other Ambulatory Visit: Payer: Medicaid Other

## 2020-12-13 ENCOUNTER — Other Ambulatory Visit: Payer: Self-pay

## 2020-12-13 ENCOUNTER — Ambulatory Visit (INDEPENDENT_AMBULATORY_CARE_PROVIDER_SITE_OTHER): Payer: Self-pay

## 2020-12-13 DIAGNOSIS — N939 Abnormal uterine and vaginal bleeding, unspecified: Secondary | ICD-10-CM

## 2020-12-20 ENCOUNTER — Ambulatory Visit (INDEPENDENT_AMBULATORY_CARE_PROVIDER_SITE_OTHER): Payer: Self-pay | Admitting: Obstetrics & Gynecology

## 2020-12-20 ENCOUNTER — Encounter: Payer: Self-pay | Admitting: Obstetrics & Gynecology

## 2020-12-20 ENCOUNTER — Other Ambulatory Visit: Payer: Self-pay

## 2020-12-20 VITALS — BP 130/81 | HR 96 | Resp 16 | Ht 68.0 in | Wt 213.0 lb

## 2020-12-20 DIAGNOSIS — Z7901 Long term (current) use of anticoagulants: Secondary | ICD-10-CM

## 2020-12-20 DIAGNOSIS — N939 Abnormal uterine and vaginal bleeding, unspecified: Secondary | ICD-10-CM

## 2020-12-20 NOTE — Patient Instructions (Signed)
Hydrothermal Endometrial Ablation  Novasure Endometrial Ablation    Endometrial Ablation Endometrial ablation is a procedure that destroys the thin inner layer of the lining of the uterus (endometrium). This procedure may be done:  To stop heavy menstrual periods.  To stop bleeding that is causing anemia.  To control irregular bleeding.  To treat bleeding caused by small tumors (fibroids) in the endometrium. This procedure is often done as an alternative to major surgery, such as removal of the uterus and cervix (hysterectomy). As a result of this procedure:  You may not be able to have children. However, if you have not yet gone through menopause: ? You may still have a small chance of getting pregnant. ? You will need to use a reliable method of birth control after the procedure to prevent pregnancy.  You may stop having a menstrual period, or you may have only a small amount of bleeding during your period. Menstruation may return several years after the procedure. Tell a health care provider about:  Any allergies you have.  All medicines you are taking, including vitamins, herbs, eye drops, creams, and over-the-counter medicines.  Any problems you or family members have had with the use of anesthetic medicines.  Any blood disorders you have.  Any surgeries you have had.  Any medical conditions you have.  Whether you are pregnant or may be pregnant. What are the risks? Generally, this is a safe procedure. However, problems may occur, including:  A hole (perforation) in the uterus or bowel.  Infection in the uterus, bladder, or vagina.  Bleeding.  Allergic reaction to medicines.  Damage to nearby structures or organs.  An air bubble in the lung (air embolus).  Problems with pregnancy.  Failure of the procedure.  Decreased ability to diagnose cancer in the endometrium. Scar tissue forms after the procedure, making it more difficult to get a sample of the  uterine lining. What happens before the procedure? Medicines Ask your health care provider about:  Changing or stopping your regular medicines. This is especially important if you take diabetes medicines or blood thinners.  Taking medicines such as aspirin and ibuprofen. These medicines can thin your blood. Do not take these medicines before your procedure if your doctor tells you not to take them.  Taking over-the-counter medicines, vitamins, herbs, and supplements. Tests  You will have tests of your endometrium to make sure there are no precancerous cells or cancer cells present.  You may have an ultrasound of the uterus. General instructions  Do not use any products that contain nicotine or tobacco for at least 4 weeks before the procedure. These include cigarettes, chewing tobacco, and vaping devices, such as e-cigarettes. If you need help quitting, ask your health care provider.  You may be given medicines to thin the endometrium.  Ask your health care provider what steps will be taken to help prevent infection. These steps may include: ? Removing hair at the surgery site. ? Washing skin with a germ-killing soap. ? Taking antibiotic medicine.  Plan to have a responsible adult take you home from the hospital or clinic.  Plan to have a responsible adult care for you for the time you are told after you leave the hospital or clinic. This is important. What happens during the procedure?  You will lie on an exam table with your feet and legs supported as in a pelvic exam.  An IV will be inserted into one of your veins.  You will be given a medicine  to help you relax (sedative).  A surgical tool with a light and camera (resectoscope) will be inserted into your vagina and moved into your uterus. This allows your surgeon to see inside your uterus.  Endometrial tissue will be destroyed and removed, using one of the following methods: ? Radiofrequency/Novasure. This uses an  electrical current to destroy the endometrium. ? Cryotherapy. This uses extreme cold to freeze the endometrium. ? Heated fluid/Hydrothermal Ablation. This uses a heated salt and water (saline) solution to destroy the endometrium. ? Microwave. This uses high-energy microwaves to heat up the endometrium and destroy it. ? Thermal balloon. This involves inserting a catheter with a balloon tip into the uterus. The balloon tip is filled with heated fluid to destroy the endometrium. The procedure may vary among health care providers and hospitals.   What happens after the procedure?  Your blood pressure, heart rate, breathing rate, and blood oxygen level will be monitored until you leave the hospital or clinic.  You may have vaginal bleeding for 4-6 weeks after the procedure. You may also have: ? Cramps. ? A thin, watery vaginal discharge that is light pink or brown. ? A need to urinate more than usual. ? Nausea.  If you were given a sedative during the procedure, it can affect you for several hours. Do not drive or operate machinery until your health care provider says that it is safe.  Do not have sex or insert anything into your vagina until your health care provider says it is safe. Summary  Endometrial ablation is done to treat many causes of heavy menstrual bleeding. The procedure destroys the thin inner layer of the lining of the uterus (endometrium).  This procedure is often done as an alternative to major surgery, such as removal of the uterus and cervix (hysterectomy).  Plan to have a responsible adult take you home from the hospital or clinic. This information is not intended to replace advice given to you by your health care provider. Make sure you discuss any questions you have with your health care provider. Document Revised: 03/01/2020 Document Reviewed: 03/01/2020 Elsevier Patient Education  [place].

## 2020-12-20 NOTE — Progress Notes (Signed)
GYNECOLOGY OFFICE VISIT NOTE  History:   Judy Welch is a 45 y.o. G0P0000 here today for discussion of possible hysterectomy for AUB control in the setting on chronic anticoagulation. History of septic endocarditis, PE, Atrial fibrillation. Reported having three surgeries after her prolonged admission for septic endocarditis during which she stopped Xarelto (hip replacement; repair of hip repair dehiscence and cardiac ablation).   She denies any other concerns.    Past Medical History:  Diagnosis Date  . Anemia   . Anxiety   . Breast disorder    left breast lump  . Depression   . Endocarditis   . Fibrillation, atrial ([place])   . Insomnia disorder   . Mental disorder    anxiety  . Ovarian cyst   . PTSD (post-traumatic stress disorder)   . Septic shock ([place])   . SVT (supraventricular tachycardia) ([place])   . Tricuspid valve disorder   . Vaginal Pap smear, abnormal     Past Surgical History:  Procedure Laterality Date  . CARDIAC ELECTROPHYSIOLOGY MAPPING AND ABLATION    . CARDIAC ELECTROPHYSIOLOGY STUDY AND ABLATION    . CARDIOVERSION    . HIP SURGERY      The following portions of the patient's history were reviewed and updated as appropriate: allergies, current medications, past family history, past medical history, past social history, past surgical history and problem list.   Health Maintenance:  Normal pap and negative HRHPV on 11/14/2020.  Normal mammogram on 12/04/2020.   Review of Systems:  Pertinent items noted in HPI and remainder of comprehensive ROS otherwise negative.  Physical Exam:  BP 130/81   Pulse 96   Resp 16   Ht 5\' 8"  (1.727 m)   Wt 213 lb (96.6 kg)   LMP 12/19/2020   BMI 32.39 kg/m  CONSTITUTIONAL: Well-developed, well-nourished female in no acute distress.  HEENT:  Normocephalic, atraumatic. External right and left ear normal. No scleral icterus.  NECK: Normal range of motion, supple, no masses noted on observation SKIN: No rash noted. Not  diaphoretic. No erythema. No pallor. MUSCULOSKELETAL: Normal range of motion. No edema noted. NEUROLOGIC: Alert and oriented to person, place, and time. Normal muscle tone coordination. No cranial nerve deficit noted. PSYCHIATRIC: Normal mood and affect. Normal behavior. Normal judgment and thought content. CARDIOVASCULAR: Normal heart rate noted RESPIRATORY: Effort and breath sounds normal, no problems with respiration noted ABDOMEN: No masses noted. No other overt distention noted.   PELVIC: Deferred  Labs and Imaging US PELVIC COMPLETE WITH TRANSVAGINAL  Result Date: 12/13/2020 CLINICAL DATA:  Abnormal uterine bleeding, LMP 11/22/2020 EXAM: TRANSABDOMINAL AND TRANSVAGINAL ULTRASOUND OF PELVIS TECHNIQUE: Both transabdominal and transvaginal ultrasound examinations of the pelvis were performed. Transabdominal technique was performed for global imaging of the pelvis including uterus, ovaries, adnexal regions, and pelvic cul-de-sac. It was necessary to proceed with endovaginal exam following the transabdominal exam to visualize the ovaries. COMPARISON:  01/09/2017 FINDINGS: Uterus Measurements: 8.3 x 3.6 x 4.7 cm = volume: 74 mL. Anteverted. Heterogeneous myometrium. No focal mass. Endometrium Thickness: 7 mm.  No endometrial fluid or definite mass. Right ovary Measurements: 5.5 x 3.3 x 3.5 cm = volume: 34 mL. Complex cystic lesion within RIGHT ovary 2.0 x 2.1 x 2.3 cm consistent with hemorrhagic cyst/hemorrhagic corpus luteum; no follow-up imaging recommended. Left ovary Measurements: 3.0 x 1.2 x 1.9 cm = volume: 4 mL. Normal morphology without mass Other findings No free pelvic fluid.  No adnexal masses. IMPRESSION: No definite pelvic sonographic abnormalities identified. Endometrial  complex is normal in thickness, 7 mm thick; if bleeding remains unresponsive to hormonal or medical therapy, sonohysterogram should be considered for focal lesion work-up. (Ref: Radiological Reasoning: Algorithmic Workup of  Abnormal Vaginal Bleeding with Endovaginal Sonography and Sonohysterography. AJR 2008; [id number]:J85-63) Electronically Signed   By: Lavonia Dana M.D.   On: 12/13/2020 16:50      Assessment and Plan:      1. Abnormal uterine bleeding (AUB) 2. Anticoagulant long-term use Given her multiple co-morbidities, I told her that she is high risk for VTE with major surgery such as hysterectomy. As a result, I am not comfortable doing this. Offered endometrial ablation instead.  She is hesitant to do this, worried about it not working, but I told her this is what I am comfortable offering her. She agreed with this plan for now. Recommended Hydrothermal Endometrial ablation, procedure was discussed.  Risks of surgery were discussed with the patient including but not limited to: bleeding; infection which may require antibiotics; injury to uterus leading to risk of injury to surrounding intraperitoneal organs, burn injury to vagina or other organs, need for additional procedures including laparoscopy or laparotomy, inability to complete ablation due to uterine or mechanical anomaly, need for trial of another ablation modality, and other postoperative/anesthesia complications.  Patient was told that the likelihood that her condition and symptoms will be treated effectively with this surgical management was very high; the postoperative expectations were also discussed in detail.  Patient was told that it is normal to have menstrual bleeding after an endometrial ablation, only about 50% of patients become amenorrheic, 40% of patients have normal or light periods, and 5-10% of patients have no change in their bleeding pattern and may need further intervention. The patient also understands the alternative treatment options which were discussed in full. All questions were answered.  She was told that she will be contacted by our surgical scheduler regarding the time and date of her surgery; routine preoperative instructions will be  given to her later.  Printed patient education handouts about the procedure were given to the patient to review at home.   Routine preventative health maintenance measures emphasized. Please refer to After Visit Summary for other counseling recommendations.   Return for any gynecologic concerns.    I spent 20 minutes dedicated to the care of this patient including pre-visit review of records, face to face time with the patient discussing her conditions and treatments and surgical counseling.    Verita Schneiders, MD, [place] for [employer], [place]

## 2020-12-25 ENCOUNTER — Telehealth: Payer: Self-pay | Admitting: *Deleted

## 2020-12-25 NOTE — Telephone Encounter (Signed)
Call to patient regarding surgery scheduling. Voice mail has name confirmation. Left message requesting call back.  In Epic, patient has scheduled another consult appointment for 3rd opinion regarding surgery. Scheduled on 02-04-21 with Dr Kennon Rounds.

## 2020-12-26 NOTE — Telephone Encounter (Signed)
Patient left message returning call.  Call back to patient. Agrees to schedule ablation but would like another opinion on success of ablation with Xeralto. Appointment with Dr Kennon Rounds 6-13. States cardiac clearance has been faxed to [place] office. Advised will hold on scheduling until questions are resolved.

## 2021-01-09 NOTE — Telephone Encounter (Signed)
Call to patient- voice mail confirms " Judy Welch." Left message have reviewed with provider who is happy to see her and discuss. May not have different outcome but keep scheduled appointment on 02-04-21 for further evalaution and decision.  Encounter closed.

## 2021-01-31 ENCOUNTER — Other Ambulatory Visit: Payer: Self-pay

## 2021-02-04 ENCOUNTER — Other Ambulatory Visit: Payer: Self-pay

## 2021-02-04 ENCOUNTER — Encounter: Payer: Self-pay | Admitting: Family Medicine

## 2021-02-04 ENCOUNTER — Ambulatory Visit (INDEPENDENT_AMBULATORY_CARE_PROVIDER_SITE_OTHER): Payer: Self-pay | Admitting: Family Medicine

## 2021-02-04 VITALS — BP 132/84 | HR 94 | Ht 68.0 in | Wt 225.0 lb

## 2021-02-04 DIAGNOSIS — L247 Irritant contact dermatitis due to plants, except food: Secondary | ICD-10-CM

## 2021-02-04 DIAGNOSIS — N939 Abnormal uterine and vaginal bleeding, unspecified: Secondary | ICD-10-CM

## 2021-02-04 DIAGNOSIS — I2690 Septic pulmonary embolism without acute cor pulmonale: Secondary | ICD-10-CM

## 2021-02-04 DIAGNOSIS — I480 Paroxysmal atrial fibrillation: Secondary | ICD-10-CM

## 2021-02-04 MED ORDER — PREDNISONE 50 MG PO TABS: 50.0000 mg | ORAL_TABLET | Freq: Every day | ORAL | 0 refills | Status: DC

## 2021-02-04 MED ORDER — OMEPRAZOLE MAGNESIUM 20 MG PO TBEC: 40.0000 mg | DELAYED_RELEASE_TABLET | Freq: Every day | ORAL | 3 refills | Status: DC

## 2021-02-04 MED ORDER — POTASSIUM CHLORIDE ER 10 MEQ PO CPCR: 10.0000 meq | ORAL_CAPSULE | ORAL | Status: AC | PRN

## 2021-02-04 MED ORDER — SERTRALINE HCL 100 MG PO TABS: 200.0000 mg | ORAL_TABLET | Freq: Every day | ORAL | Status: AC

## 2021-02-04 MED ORDER — DOCUSATE SODIUM 100 MG PO CAPS: 100.0000 mg | ORAL_CAPSULE | Freq: Two times a day (BID) | ORAL | 2 refills | Status: AC | PRN

## 2021-02-04 MED ORDER — PREGABALIN 200 MG PO CAPS: 200.0000 mg | ORAL_CAPSULE | Freq: Three times a day (TID) | ORAL | Status: AC

## 2021-02-04 MED ORDER — FUROSEMIDE 20 MG PO TABS: 20.0000 mg | ORAL_TABLET | ORAL | Status: AC | PRN

## 2021-02-04 MED ORDER — VITAMIN B-6 50 MG PO TABS: 50.0000 mg | ORAL_TABLET | Freq: Every day | ORAL | Status: DC

## 2021-02-04 MED ORDER — RIVAROXABAN 20 MG PO TABS: 20.0000 mg | ORAL_TABLET | Freq: Every day | ORAL | Status: AC

## 2021-02-04 NOTE — Assessment & Plan Note (Signed)
Would need peri-operative management of Xarelto, will review with pharmacy.

## 2021-02-04 NOTE — Progress Notes (Signed)
   Subjective:    Patient ID: Judy Welch is a 45 y.o. female presenting with Discuss Surgery  on 02/04/2021  HPI: Has very heavy cycles that are lasting 10 days. She has a long h/o abnormal bleeding, previously controlled on OCPs. She stopped these following sepsis, tricuspid endocarditis and septic emboli.  Has h/o of worsening PE burden following dc. Has paroxysmal A. Fib on Xarelto now. This has made her bleeding even worse. Has seen 2 partners who have advised ablation only due to risks of DVT/PE. She is a G0P0000 who has a normal uterus of 8 wks size. Normal pap and EMB.  Review of Systems  Constitutional:  Negative for chills and fever.  Respiratory:  Negative for shortness of breath.   Cardiovascular:  Negative for chest pain.  Gastrointestinal:  Negative for abdominal pain, nausea and vomiting.  Genitourinary:  Negative for dysuria.  Skin:  Negative for rash.     Objective:    BP 132/84   Pulse 94   Ht 5\' 8"  (1.727 m)   Wt 225 lb (102.1 kg)   LMP 01/13/2021   BMI 34.21 kg/m  Physical Exam Constitutional:      General: She is not in acute distress.    Appearance: She is well-developed.  HENT:     Head: Normocephalic and atraumatic.  Eyes:     General: No scleral icterus. Cardiovascular:     Rate and Rhythm: Normal rate.  Pulmonary:     Effort: Pulmonary effort is normal.  Abdominal:     Palpations: Abdomen is soft.  Musculoskeletal:     Cervical back: Neck supple.  Skin:    General: Skin is warm and dry.  Neurological:     Mental Status: She is alert and oriented to person, place, and time.        Assessment & Plan:   Problem List Items Addressed This Visit       Unprioritized   Abnormal uterine bleeding (AUB)    Declines attempts at IUD, not a candidate for OCPs, does not want endometrial ablation, as this might not work. Will book for [facility]. Risks are numerous and reviewed. Given small uterine size and no prior scars, TVH is best. Would do bilateral  salpingectomy as well. She reports prior cardiac clearance for surgery. Normal surgery and recovery reviewed. Risks include but are not limited to bleeding, infection, injury to surrounding structures, including bowel, bladder and ureters, blood clots, and death.  Likelihood of success is high.        Paroxysmal atrial fibrillation (HCC)   Relevant Medications   rivaroxaban (XARELTO) 20 MG TABS tablet   furosemide (LASIX) 20 MG tablet   Pulmonary embolus (HCC)    Would need peri-operative management of Xarelto, will review with pharmacy.       Relevant Medications   rivaroxaban (XARELTO) 20 MG TABS tablet   furosemide (LASIX) 20 MG tablet   Other Visit Diagnoses     Contact dermatitis and eczema due to plant    -  Primary   Relevant Medications   predniSONE (DELTASONE) 50 MG tablet      Greater than 55 minutes spent on chart review, review of labs, pathology, notes from hospitalizations, cardiology, review of surgery, risks, recovery, hospitalization, review of IUD, ablation as alternatives to treatment.  Return in about 3 months (around 05/07/2021) for postop check.  Judy Welch 02/04/2021 5:20 PM

## 2021-02-04 NOTE — Assessment & Plan Note (Addendum)
Declines attempts at IUD, not a candidate for OCPs, does not want endometrial ablation, as this might not work. Will book for [facility]. Risks are numerous and reviewed. Given small uterine size and no prior scars, TVH is best. Would do bilateral salpingectomy as well. She reports prior cardiac clearance for surgery. Normal surgery and recovery reviewed. Risks include but are not limited to bleeding, infection, injury to surrounding structures, including bowel, bladder and ureters, blood clots, and death.  Likelihood of success is high.

## 2021-02-05 ENCOUNTER — Telehealth: Payer: Self-pay | Admitting: *Deleted

## 2021-02-05 MED ORDER — PREDNISONE 10 MG PO TABS: ORAL_TABLET | ORAL | 0 refills | Status: DC

## 2021-02-05 NOTE — Telephone Encounter (Signed)
Pt called concerned about how the Predisone she was prescribed yesterday is making her feel.  Pt states,"I feel like I'm on Crack"  My heart rate is 198 but BP is fine.  I don't like how I feel.  Pt denies any SOB.  She wanted to see if the med was filled correctly at [facility].  The RX was to be 50 mg daily with breakfast for 5 days.  This is the correct RX.  I have reached out to Dr Kennon Rounds for consultation and recommendations for pt.

## 2021-02-05 NOTE — Telephone Encounter (Signed)
I have spoken with Dr Kennon Rounds who has changed the Prednisone to a tapered dose of 40 mg today 30 mg Wed, 20 mg Thursday and 10 mg Friday.  Pt is aware and RX sent to [facility] in [place]

## 2021-02-08 ENCOUNTER — Telehealth: Payer: Self-pay | Admitting: *Deleted

## 2021-02-08 NOTE — Telephone Encounter (Signed)
Call to patient to discus surgery date options. Patient chooses 04-09-21. Brief review of post-op recovery restrictions. Patient is on Xeralto and cardiologist has already advised her to discontinue two days prior to surgery. Patient also reports concerns over dropping Hgb due to menses. Advised if bleeding continues or becomes symptomatic with anemia, to call direct to office to discuss.

## 2021-02-11 ENCOUNTER — Encounter: Payer: Self-pay | Admitting: *Deleted

## 2021-02-11 ENCOUNTER — Emergency Department (INDEPENDENT_AMBULATORY_CARE_PROVIDER_SITE_OTHER)
Admission: EM | Admit: 2021-02-11 | Discharge: 2021-02-11 | Disposition: A | Discharge status: Home / Self Care | Payer: Self-pay | Source: Home / Self Care

## 2021-02-11 ENCOUNTER — Encounter: Payer: Self-pay | Admitting: Family Medicine

## 2021-02-11 ENCOUNTER — Other Ambulatory Visit: Payer: Self-pay

## 2021-02-11 DIAGNOSIS — J3489 Other specified disorders of nose and nasal sinuses: Secondary | ICD-10-CM

## 2021-02-11 DIAGNOSIS — J0100 Acute maxillary sinusitis, unspecified: Secondary | ICD-10-CM

## 2021-02-11 MED ORDER — PREDNISONE 20 MG PO TABS: ORAL_TABLET | ORAL | 0 refills | Status: DC

## 2021-02-11 MED ORDER — ACETAMINOPHEN 325 MG PO TABS
650.0000 mg | ORAL_TABLET | Freq: Once | ORAL | Status: AC
  Administered 2021-02-11: 650 mg via ORAL

## 2021-02-11 MED ORDER — ONDANSETRON 4 MG PO TBDP
4.0000 mg | ORAL_TABLET | Freq: Once | ORAL | Status: AC
  Administered 2021-02-11: 4 mg via ORAL

## 2021-02-11 MED ORDER — AMOXICILLIN-POT CLAVULANATE 875-125 MG PO TABS: 1.0000 | ORAL_TABLET | Freq: Two times a day (BID) | ORAL | 0 refills | Status: DC

## 2021-02-11 NOTE — Discharge Instructions (Addendum)
Advised/instructed patient to take medication as directed with food to completion.  Encourage patient increase daily water intake while taking these medications.

## 2021-02-11 NOTE — ED Triage Notes (Signed)
Cough, sore throat, sinus pressure, headache w/ body aches since yesterday  COVID vaccine + booster  Hx of sepsis w/ previous sinus infection - hospitalized for 46 days Mucinex last night

## 2021-02-11 NOTE — ED Provider Notes (Signed)
[facility]y [facility]    CSN: [id number] Arrival date & time: 02/11/21  0841      History   Chief Complaint Chief Complaint  Patient presents with   Generalized Body Aches   Sore Throat    HPI DIANELY KREHBIEL is a 45 y.o. female.   HPI 45 year old female presents with cough, sore throat, sinus pressure, headache, and body aches for 24 hours.  Patient reports has been vaccinated for COVID-19 and has received COVID-19 booster.  Reports history of sepsis with previous sinus infection and was hospitalized for 46 days, reports taking Mucinex last night.  PMH significant for paroxysmal atrial fibrillation and pulmonary embolus.  Past Medical History:  Diagnosis Date   Anemia    Anxiety    Breast disorder    left breast lump   Depression    Endocarditis    Fibrillation, atrial (HCC)    Insomnia disorder    Mental disorder    anxiety   Ovarian cyst    PTSD (post-traumatic stress disorder)    Septic shock (HCC)    SVT (supraventricular tachycardia) (HCC)    Tricuspid valve disorder    Vaginal Pap smear, abnormal     Patient Active Problem List   Diagnosis Date Noted   Anticoagulant long-term use 12/20/2020   Abnormal uterine bleeding (AUB) 11/14/2020   Generalized anxiety disorder with panic attacks 11/13/2020   MDD (major depressive disorder), recurrent episode, moderate ([place]) 11/13/2020   Primary insomnia 11/13/2020   Arthritis of right hip 05/03/2020   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   History of alcohol use disorder 07/02/2019   Hypokalemia 06/08/2019   Microcytic anemia 06/08/2019   Paroxysmal atrial fibrillation ([place]) 06/07/2019   Pulmonary embolus ([place]) 06/07/2019   Chronic pain syndrome 03/08/2018   SI (sacroiliac) pain 08/19/2017   Neuropathy, alcoholic ([place]) 69/67/8938   Anemia 09/26/2013    Past Surgical History:  Procedure Laterality Date   CARDIAC ELECTROPHYSIOLOGY MAPPING AND ABLATION      CARDIAC ELECTROPHYSIOLOGY STUDY AND ABLATION     CARDIOVERSION     HIP SURGERY      OB History     Gravida  0   Para  0   Term  0   Preterm  0   AB  0   Living  0      SAB  0   IAB  0   Ectopic  0   Multiple  0   Live Births  0            Home Medications    Prior to Admission medications   Medication Sig Start Date End Date Taking? Authorizing Provider  amoxicillin-clavulanate (AUGMENTIN) 875-125 MG tablet Take 1 tablet by mouth every 12 (twelve) hours. 02/11/21  Yes Eliezer Lofts, FNP  predniSONE (DELTASONE) 20 MG tablet Take 3 tabs PO daily x 5 days. 02/11/21  Yes Eliezer Lofts, FNP  cyclobenzaprine (FLEXERIL) 10 MG tablet Take by mouth. 01/21/16   [provider]  docusate sodium (COLACE) 100 MG capsule Take 1 capsule (100 mg total) by mouth 2 (two) times daily as needed. 02/04/21   [name], MD  dofetilide Phyllis Ginger) 250 MCG capsule Take by mouth. 07/12/20   [provider]  furosemide (LASIX) 20 MG tablet Take 1 tablet (20 mg total) by mouth as needed. 02/04/21   [name], MD  hydrOXYzine (VISTARIL) 100 MG capsule Take by mouth.    [provider]  LORazepam (ATIVAN) 1 MG tablet Take by mouth. 12/12/20   [provider]  omeprazole (PRILOSEC OTC) 20 MG tablet Take 2 tablets (40 mg total) by mouth daily. 02/04/21   [name], MD  potassium chloride (MICRO-K) 10 MEQ CR capsule Take 1 capsule (10 mEq total) by mouth as needed. 02/04/21   [name], MD  pregabalin (LYRICA) 200 MG capsule Take 1 capsule (200 mg total) by mouth in the morning, at noon, and at bedtime. 02/04/21   [name], MD  pyridOXINE (VITAMIN B-6) 50 MG tablet Take 1 tablet (50 mg total) by mouth daily. 02/04/21   [name], MD  rivaroxaban (XARELTO) 20 MG TABS tablet Take 1 tablet (20 mg total) by mouth daily with supper. 02/04/21   [name], MD  sertraline (ZOLOFT) 100 MG tablet Take 2 tablets (200 mg total) by mouth daily.  02/04/21   [name], MD  zolpidem Lorrin Mais) 10 MG tablet Take by mouth. 12/12/20   [provider]    Family History Family History  Problem Relation Age of Onset   Diabetes Father    Hypertension Father    Cancer Mother        bladder   Cancer Maternal Aunt        breast   Stroke Neg Hx     Social History Social History   Tobacco Use   Smoking status: Never   Smokeless tobacco: Never  Vaping Use   Vaping Use: Never used  Substance Use Topics   Alcohol use: No   Drug use: No     Allergies   Sulfamethoxazole-trimethoprim   Review of Systems Review of Systems  HENT:  Positive for sinus pressure and sore throat.   Eyes: Negative.   Respiratory:  Positive for cough.   Cardiovascular: Negative.   Gastrointestinal: Negative.   Musculoskeletal:  Positive for myalgias.  Skin: Negative.   Neurological: Negative.     Physical Exam Triage Vital Signs ED Triage Vitals  Enc Vitals Group     BP 02/11/21 0858 115/79     Pulse Rate 02/11/21 0858 95     Resp 02/11/21 0858 17     Temp 02/11/21 0858 98.4 F (36.9 C)     Temp Source 02/11/21 0858 Oral     SpO2 02/11/21 0858 99 %     Weight --      Height --      Head Circumference --      Peak Flow --      Pain Score 02/11/21 0900 5     Pain Loc --      Pain Edu? --      Excl. in [place]? --    No data found.  Updated Vital Signs BP 115/79 (BP Location: Right Arm)   Pulse 95   Temp 98.4 F (36.9 C) (Oral)   Resp 17   LMP 01/13/2021   SpO2 99%      Physical Exam Vitals and nursing note reviewed.  Constitutional:      General: She is not in acute distress.    Appearance: She is well-developed. She is obese. She is ill-appearing.  HENT:     Head: Atraumatic.     Right Ear: Tympanic membrane and ear canal normal.     Left Ear: Tympanic membrane and ear canal normal.     Nose: Congestion present.     Right Turbinates: Enlarged.     Left  Turbinates: Enlarged.     Right Sinus: Maxillary sinus  tenderness and frontal sinus tenderness present.     Left Sinus: Maxillary sinus tenderness and frontal sinus tenderness present.     Comments: Turbinates are erythematous TTP over her bilateral frontal and maxillary sinuses, reporting 7 of 10 sinus pressure    Mouth/Throat:     Mouth: Mucous membranes are moist.     Pharynx: Oropharynx is clear.  Eyes:     Extraocular Movements: Extraocular movements intact.     Conjunctiva/sclera: Conjunctivae normal.     Pupils: Pupils are equal, round, and reactive to light.  Cardiovascular:     Rate and Rhythm: Normal rate and regular rhythm.     Pulses: Normal pulses.     Heart sounds: Normal heart sounds.  Pulmonary:     Effort: Pulmonary effort is normal.     Breath sounds: Normal breath sounds.     Comments: No adventitious breath noted Musculoskeletal:        General: Normal range of motion.     Cervical back: Normal range of motion and neck supple. Tenderness present.  Lymphadenopathy:     Cervical: Cervical adenopathy present.  Skin:    General: Skin is warm and dry.  Neurological:     General: No focal deficit present.     Mental Status: She is alert and oriented to person, place, and time.  Psychiatric:        Mood and Affect: Mood normal.        Behavior: Behavior normal.     UC Treatments / Results  Labs (all labs ordered are listed, but only abnormal results are displayed) Labs Reviewed - No data to display  EKG   Radiology No results found.  Procedures Procedures (including critical care time)  Medications Ordered in UC Medications  ondansetron (ZOFRAN-ODT) disintegrating tablet 4 mg (4 mg Oral Given 02/11/21 0908)  acetaminophen (TYLENOL) tablet 650 mg (650 mg Oral Given 02/11/21 0912)    Initial Impression / Assessment and Plan / UC Course  I have reviewed the triage vital signs and the nursing notes.  Pertinent labs & imaging results that were available during my care of the patient were reviewed by me and  considered in my medical decision making (see chart for details).     MDM: 1. Subacute maxillary sinusitis, 2.  Sinus pressure.  Patient discharged home, hemodynamically stable Final Clinical Impressions(s) / UC Diagnoses   Final diagnoses:  Subacute maxillary sinusitis  Sinus pressure     Discharge Instructions      Advised/instructed patient to take medication as directed with food to completion.  Encourage patient increase daily water intake while taking these medications.     ED Prescriptions     Medication Sig Dispense Auth. Provider   amoxicillin-clavulanate (AUGMENTIN) 875-125 MG tablet Take 1 tablet by mouth every 12 (twelve) hours. 14 tablet Eliezer Lofts, FNP   predniSONE (DELTASONE) 20 MG tablet Take 3 tabs PO daily x 5 days. 15 tablet Eliezer Lofts, FNP      PDMP not reviewed this encounter.   Eliezer Lofts, [place] 02/11/21 [phone]

## 2021-02-11 NOTE — Telephone Encounter (Signed)
Call to patient- after several changes, advised have settled on 02-27-21 at 1230 at [facility] main. Arrive 1030 am. Advised will receive letter and pre-op call with instructions.  Encounter closed.

## 2021-02-12 ENCOUNTER — Telehealth: Payer: Self-pay | Admitting: *Deleted

## 2021-02-12 ENCOUNTER — Encounter: Payer: Self-pay | Admitting: *Deleted

## 2021-02-12 NOTE — Telephone Encounter (Signed)
Call to patient. Advised surgery time changed to 11:30 am and arrive at 9:30 am.   Encounter closed.

## 2021-02-18 ENCOUNTER — Other Ambulatory Visit: Payer: Self-pay

## 2021-02-18 ENCOUNTER — Emergency Department (INDEPENDENT_AMBULATORY_CARE_PROVIDER_SITE_OTHER): Payer: Medicaid Other

## 2021-02-18 ENCOUNTER — Emergency Department (INDEPENDENT_AMBULATORY_CARE_PROVIDER_SITE_OTHER)
Admission: EM | Admit: 2021-02-18 | Discharge: 2021-02-18 | Disposition: A | Discharge status: Home / Self Care | Payer: Self-pay | Source: Home / Self Care | Attending: Family Medicine | Admitting: Family Medicine

## 2021-02-18 DIAGNOSIS — R161 Splenomegaly, not elsewhere classified: Secondary | ICD-10-CM

## 2021-02-18 DIAGNOSIS — R918 Other nonspecific abnormal finding of lung field: Secondary | ICD-10-CM

## 2021-02-18 DIAGNOSIS — R059 Cough, unspecified: Secondary | ICD-10-CM

## 2021-02-18 DIAGNOSIS — Z20822 Contact with and (suspected) exposure to covid-19: Secondary | ICD-10-CM

## 2021-02-18 MED ORDER — PREDNISONE 20 MG PO TABS: 20.0000 mg | ORAL_TABLET | Freq: Two times a day (BID) | ORAL | 0 refills | Status: DC

## 2021-02-18 MED ORDER — BENZONATATE 200 MG PO CAPS: 200.0000 mg | ORAL_CAPSULE | Freq: Two times a day (BID) | ORAL | 0 refills | Status: DC | PRN

## 2021-02-18 NOTE — Discharge Instructions (Addendum)
Check for COVID test results on MyChart Your chest x-ray is suspicious for COVID or viral infection Drink lots of fluids Take prednisone as directed Take Tessalon for cough You must stay off work until you have received your test result.  If COVID-positive you cannot return to healthcare until your symptoms have resolved, your fever is gone, and consider repeat COVID testing Follow-up with your primary care doctor

## 2021-02-18 NOTE — H&P (Signed)
Judy Welch is an 45 y.o. [place] female.   Chief Complaint: abnormal bleeding HPI: Has very heavy cycles that are lasting 10 days. She has a long h/o abnormal bleeding, previously controlled on OCPs. She stopped these following sepsis, tricuspid endocarditis and septic emboli.  Has h/o of worsening PE burden following dc. Has paroxysmal A. Fib on Xarelto now. This has made her bleeding even worse. Has seen 2 partners who have advised ablation only due to risks of DVT/PE. She is a G0P0000 who has a normal uterus of 8 wks size. Normal pap and EMB.  Past Medical History:  Diagnosis Date   Anemia    Anxiety    Breast disorder    left breast lump   Depression    Endocarditis    Fibrillation, atrial (HCC)    Insomnia disorder    Mental disorder    anxiety   Ovarian cyst    PTSD (post-traumatic stress disorder)    Septic shock (HCC)    SVT (supraventricular tachycardia) (HCC)    Tricuspid valve disorder    Vaginal Pap smear, abnormal     Past Surgical History:  Procedure Laterality Date   CARDIAC ELECTROPHYSIOLOGY MAPPING AND ABLATION     CARDIAC ELECTROPHYSIOLOGY STUDY AND ABLATION     CARDIOVERSION     HIP SURGERY      Family History  Problem Relation Age of Onset   Diabetes Father    Hypertension Father    Cancer Mother        bladder   Cancer Maternal Aunt        breast   Stroke Neg Hx    Social History:  reports that she has never smoked. She has never used smokeless tobacco. She reports that she does not drink alcohol and does not use drugs.  Allergies:  Allergies  Allergen Reactions   Sulfamethoxazole-Trimethoprim     No medications prior to admission.    Pertinent items are noted in HPI.  Last menstrual period 02/12/2021. General appearance: alert, cooperative, and appears stated age Head: Normocephalic, without obvious abnormality, atraumatic Neck: supple, symmetrical, trachea midline Lungs: clear to auscultation bilaterally Heart: regular rate and  rhythm, S1, S2 normal, no murmur, click, rub or gallop Abdomen: soft, non-tender; bowel sounds normal; no masses,  no organomegaly Extremities: no edema, redness or tenderness in the calves or thighs Skin: Skin color, texture, turgor normal. No rashes or lesions Neurologic: Grossly normal   No results found for: WBC, HGB, HCT, MCV, PLT Lab Results  Component Value Date   PREGTESTUR Negative 12/06/2020     Assessment/Plan Principal Problem:   Abnormal uterine bleeding (AUB)  For TVH and bilateral salpingectomy Declines attempts at IUD, not a candidate for OCPs, does not want endometrial ablation, as this might not work. Will book for [facility]. Risks are numerous and reviewed. Given small uterine size and no prior scars, TVH is best. Would do bilateral salpingectomy as well. She reports prior cardiac clearance for surgery. Normal surgery and recovery reviewed. Risks include but are not limited to bleeding, infection, injury to surrounding structures, including bowel, bladder and ureters, blood clots, and death.  Likelihood of success is high.    Judy Welch 02/18/2021, 4:47 PM

## 2021-02-18 NOTE — ED Triage Notes (Addendum)
Pt c/o cough, fever and generalized headaches x 8 days. Fever was low grade at start of illness. Chills last night. Burning in chest, coughing up yellow mucous. Was seen on 6/20 and was tx with antibiotic and prednisone taper. Has finished prednisone. No covid or flu testing done. COVID vaccinated and boosted.

## 2021-02-19 ENCOUNTER — Telehealth: Payer: Self-pay | Admitting: Emergency Medicine

## 2021-02-19 ENCOUNTER — Other Ambulatory Visit: Payer: Self-pay

## 2021-02-19 LAB — CBC WITH DIFFERENTIAL/PLATELET
Absolute Monocytes: 710 cells/uL (ref 200–950)
Basophils Absolute: 21 cells/uL (ref 0–200)
Basophils Relative: 0.2 %
Eosinophils Absolute: 201 cells/uL (ref 15–500)
Eosinophils Relative: 1.9 %
HCT: 30.5 % — ABNORMAL LOW (ref 35.0–45.0)
Hemoglobin: 9.5 g/dL — ABNORMAL LOW (ref 11.7–15.5)
Lymphs Abs: 1187 cells/uL (ref 850–3900)
MCH: 24.1 pg — ABNORMAL LOW (ref 27.0–33.0)
MCHC: 31.1 g/dL — ABNORMAL LOW (ref 32.0–36.0)
MCV: 77.2 fL — ABNORMAL LOW (ref 80.0–100.0)
MPV: 11.4 fL (ref 7.5–12.5)
Monocytes Relative: 6.7 %
Neutro Abs: 8480 cells/uL — ABNORMAL HIGH (ref 1500–7800)
Neutrophils Relative %: 80 %
Platelets: 319 10*3/uL (ref 140–400)
RBC: 3.95 10*6/uL (ref 3.80–5.10)
RDW: 16.2 % — ABNORMAL HIGH (ref 11.0–15.0)
Total Lymphocyte: 11.2 %
WBC: 10.6 10*3/uL (ref 3.8–10.8)

## 2021-02-19 NOTE — ED Provider Notes (Signed)
[name]    CSN: [id number] Arrival date & time: 02/18/21  1119      History   Chief Complaint Chief Complaint  Patient presents with   Cough   Fever   Generalized Body Aches    HPI Judy Welch is a 45 y.o. female.   HPI Patient is here for follow-up.  She was seen on 02/11/2021 for respiratory infection.  She was evaluated and had physical exam findings and symptoms consistent with sinusitis.  She was treated with Augmentin and supportive medications.  She was not tested for any viral illness COVID or influenza.  Patient is a COVID vaccinated and boosted.  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  She is here because she is still sick.  She complains of fever and generalized headaches for the [place] continued for the a day.  On arrival her temperature was 100.3.  She states it still spikes up to 101 on almost a daily basis.  She still has cough and a burning feeling in her chest.  She is coughing up yellow mucus.  She states that she had no known exposure to COVID, influenza, or illness.  She continues to have mild sinus symptoms but currently mostly has coughing. Patient is concerned because she is scheduled to have a hysterectomy on June 6.  She wants to make sure she is well enough to have her surgery.  Patient has a long and complicated medical history.  It is reviewed with her.  She states that her heart disease is currently stable.  She is chronically anticoagulated because of atrial fibrillation.    Past Medical History:  Diagnosis Date   Anemia    Anxiety    Breast disorder    left breast lump   Depression    Endocarditis    Fibrillation, atrial (HCC)    Insomnia disorder    Mental disorder    anxiety   Ovarian cyst    PTSD (post-traumatic stress disorder)    Septic shock (HCC)    SVT (supraventricular tachycardia) (HCC)    Tricuspid valve disorder    Vaginal Pap smear, abnormal     Patient Active Problem List   Diagnosis Date  Noted   Anticoagulant long-term use 12/20/2020   Abnormal uterine bleeding (AUB) 11/14/2020   Generalized anxiety disorder with panic attacks 11/13/2020   MDD (major depressive disorder), recurrent episode, moderate ([place]) 11/13/2020   Primary insomnia 11/13/2020   Arthritis of right hip 05/03/2020   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   History of alcohol use disorder 07/02/2019   Hypokalemia 06/08/2019   Microcytic anemia 06/08/2019   Paroxysmal atrial fibrillation ([place]) 06/07/2019   Pulmonary embolus ([place]) 06/07/2019   Chronic pain syndrome 03/08/2018   SI (sacroiliac) pain 08/19/2017   Neuropathy, alcoholic ([place]) 25/00/3704   Anemia 09/26/2013    Past Surgical History:  Procedure Laterality Date   CARDIAC ELECTROPHYSIOLOGY MAPPING AND ABLATION     CARDIAC ELECTROPHYSIOLOGY STUDY AND ABLATION     CARDIOVERSION     HIP SURGERY      OB History     Gravida  0   Para  0   Term  0   Preterm  0   AB  0   Living  0      SAB  0   IAB  0   Ectopic  0   Multiple  0   Live Births  0  Home Medications    Prior to Admission medications   Medication Sig Start Date End Date Taking? Authorizing Provider  benzonatate (TESSALON) 200 MG capsule Take 1 capsule (200 mg total) by mouth 2 (two) times daily as needed for cough. 02/18/21  Yes Raylene Everts, MD  predniSONE (DELTASONE) 20 MG tablet Take 1 tablet (20 mg total) by mouth 2 (two) times daily with a meal. 02/18/21  Yes Raylene Everts, MD  amoxicillin-clavulanate (AUGMENTIN) 875-125 MG tablet Take 1 tablet by mouth every 12 (twelve) hours. 02/11/21   Eliezer Lofts, FNP  cyclobenzaprine (FLEXERIL) 10 MG tablet Take by mouth. 01/21/16   [provider]  docusate sodium (COLACE) 100 MG capsule Take 1 capsule (100 mg total) by mouth 2 (two) times daily as needed. 02/04/21   [name], MD  dofetilide Phyllis Ginger) 250 MCG capsule Take by  mouth. 07/12/20   [provider]  furosemide (LASIX) 20 MG tablet Take 1 tablet (20 mg total) by mouth as needed. 02/04/21   [name], MD  hydrOXYzine (VISTARIL) 100 MG capsule Take by mouth.    [provider]  LORazepam (ATIVAN) 1 MG tablet Take by mouth. 12/12/20   [provider]  omeprazole (PRILOSEC OTC) 20 MG tablet Take 2 tablets (40 mg total) by mouth daily. 02/04/21   [name], MD  potassium chloride (MICRO-K) 10 MEQ CR capsule Take 1 capsule (10 mEq total) by mouth as needed. 02/04/21   [name], MD  pregabalin (LYRICA) 200 MG capsule Take 1 capsule (200 mg total) by mouth in the morning, at noon, and at bedtime. 02/04/21   [name], MD  pyridOXINE (VITAMIN B-6) 50 MG tablet Take 1 tablet (50 mg total) by mouth daily. 02/04/21   [name], MD  rivaroxaban (XARELTO) 20 MG TABS tablet Take 1 tablet (20 mg total) by mouth daily with supper. 02/04/21   [name], MD  sertraline (ZOLOFT) 100 MG tablet Take 2 tablets (200 mg total) by mouth daily. 02/04/21   [name], MD  zolpidem Lorrin Mais) 10 MG tablet Take by mouth. 12/12/20   [provider]    Family History Family History  Problem Relation Age of Onset   Diabetes Father    Hypertension Father    Cancer Mother        bladder   Cancer Maternal Aunt        breast   Stroke Neg Hx     Social History Social History   Tobacco Use   Smoking status: Never   Smokeless tobacco: Never  Vaping Use   Vaping Use: Never used  Substance Use Topics   Alcohol use: No   Drug use: No     Allergies   Sulfamethoxazole-trimethoprim   Review of Systems Review of Systems  See HPI Physical Exam Triage Vital Signs ED Triage Vitals  Enc Vitals Group     BP 02/18/21 1131 (!) 146/91     Pulse Rate 02/18/21 1131 (!) 111     Resp 02/18/21 1131 19     Temp 02/18/21 1131 100.3 F (37.9 C)     Temp Source 02/18/21 1131 Oral     SpO2 02/18/21 1131 98 %     Weight --       Height --      Head Circumference --      Peak Flow --      Pain Score 02/18/21 1132 0  Pain Loc --      Pain Edu? --      Excl. in [place]? --    No data found.  Updated Vital Signs BP (!) 146/91 (BP Location: Right Arm)   Pulse (!) 111   Temp 100.3 F (37.9 C) (Oral)   Resp 19   LMP 02/12/2021 (Exact Date)   SpO2 98%       Physical Exam Constitutional:      General: She is not in acute distress.    Appearance: She is well-developed. She is obese. She is ill-appearing.     Comments: Patient appears ill.  She appears tired.  HENT:     Head: Normocephalic and atraumatic.     Right Ear: Tympanic membrane and ear canal normal.     Left Ear: Tympanic membrane and ear canal normal.     Nose: Congestion present.     Mouth/Throat:     Mouth: Mucous membranes are moist.     Pharynx: No posterior oropharyngeal erythema.     Comments: No significant sinus tenderness.  No cervical adenopathy Eyes:     Conjunctiva/sclera: Conjunctivae normal.     Pupils: Pupils are equal, round, and reactive to light.  Cardiovascular:     Rate and Rhythm: Regular rhythm. Tachycardia present.     Heart sounds: Normal heart sounds.  Pulmonary:     Effort: Pulmonary effort is normal. No respiratory distress.     Breath sounds: Rales present.     Comments: No wheeze.  Faint rales in bases Abdominal:     General: There is no distension.     Palpations: Abdomen is soft.  Musculoskeletal:        General: Normal range of motion.     Cervical back: Normal range of motion.     Right lower leg: No edema.     Left lower leg: No edema.  Skin:    General: Skin is warm and dry.  Neurological:     Mental Status: She is alert. Mental status is at baseline.  Psychiatric:        Mood and Affect: Mood normal.        Behavior: Behavior normal.     UC Treatments / Results  Labs (all labs ordered are listed, but only abnormal results are displayed) Labs Reviewed  CBC WITH DIFFERENTIAL/PLATELET -  Abnormal; Notable for the following components:      Result Value   Hemoglobin 9.5 (*)    HCT 30.5 (*)    MCV 77.2 (*)    MCH 24.1 (*)    MCHC 31.1 (*)    RDW 16.2 (*)    Neutro Abs 8,480 (*)    All other components within normal limits  COVID-19, FLU A+B NAA    EKG   Radiology DG Chest 2 View  Result Date: 02/18/2021 CLINICAL DATA:  Cough. EXAM: CHEST - 2 VIEW COMPARISON:  January 31, 2018. FINDINGS: The heart size and mediastinal contours are within normal limits. Small left apical nodule is noted. Minimal scarring or atelectasis is noted in right upper lobe. The visualized skeletal structures are unremarkable. IMPRESSION: Minimal scarring or atelectasis seen in right upper lobe. Small left apical nodule is noted; CT scan of the chest is recommended for further evaluation. Electronically Signed   By: Marijo Conception M.D.   On: 02/18/2021 12:48   CT Chest Wo Contrast  Result Date: 02/18/2021 CLINICAL DATA:  Cough Lung nodule seen at left apex. EXAM: CT CHEST WITHOUT  CONTRAST TECHNIQUE: Multidetector CT imaging of the chest was performed following the standard protocol without IV contrast. COMPARISON:  CT chest 01/31/2018 FINDINGS: Cardiovascular: Heart size within normal limits. No pericardial effusion. Mediastinum/Nodes: No enlarged axillary or supraclavicular lymph nodes. Nonenlarged lymph nodes noted throughout the mediastinum, similar to prior exam. Lungs/Pleura: Bilateral ground-glass opacities most likely due to infectious pneumonitis. No pleural effusion Upper Abdomen: Spleen appears enlarged and has increased in size since prior exam. No acute abnormality abdomen. Musculoskeletal: Nodule seen recent chest x-ray corresponds to a bone island the posterior segment of the left third rib. IMPRESSION: 1. Nodule seen on recent chest x-ray corresponds to bone island in posterior left third rib. 2. Bilateral ground-glass opacities most likely due to infectious pneumonitis. 3. Apparent  splenomegaly. Further evaluation with left upper quadrant ultrasound should be performed to better determine spleen volume. Electronically Signed   By: Miachel Roux M.D.   On: 02/18/2021 13:52    Procedures Procedures (including critical care time)  Medications Ordered in UC Medications - No data to display  Initial Impression / Assessment and Plan / UC Course  I have reviewed the triage vital signs and the nursing notes.  Pertinent labs & imaging results that were available during my care of the patient were reviewed by me and considered in my medical decision making (see chart for details).     Patient has for symptoms for more than 10 days and has persistent fever and chills.  Her CBC and chest x-ray do not lend a specific diagnosis although COVID is suspected with groundglass infiltrates bilaterally.  She has a new diagnosis of anemia and splenomegaly.  I told her that this may limit her ability to have her hysterectomy next week.  She was called at home and told specifically to follow-up with her primary care doctor ASAP.  She was treated symptomatically with prednisone and Tessalon Final Clinical Impressions(s) / UC Diagnoses   Final diagnoses:  Cough  Suspected COVID-19 virus infection     Discharge Instructions      Check for COVID test results on MyChart Your chest x-ray is suspicious for COVID or viral infection Drink lots of fluids Take prednisone as directed Take Tessalon for cough You must stay off work until you have received your test result.  If COVID-positive you cannot return to healthcare until your symptoms have resolved, your fever is gone, and consider repeat COVID testing Follow-up with your primary care doctor      ED Prescriptions     Medication Sig Dispense Auth. Provider   predniSONE (DELTASONE) 20 MG tablet Take 1 tablet (20 mg total) by mouth 2 (two) times daily with a meal. 10 tablet Raylene Everts, MD   benzonatate (TESSALON) 200 MG  capsule Take 1 capsule (200 mg total) by mouth 2 (two) times daily as needed for cough. 20 capsule Raylene Everts, MD      PDMP not reviewed this encounter.   Raylene Everts, MD 02/19/21 [phone]

## 2021-02-19 NOTE — Telephone Encounter (Signed)
Call back from [facility] regarding COVID test results ( in process) RN also reviewed lab results w/ Barbera Setters regarding message left by Dr Meda Coffee. Judy Welch is scheduled for a hysterectomy & stated her pre-op is scheduled for 02/21/21. RN directed pt to contact GYN doctor today to see if surgery can move forward at this time. Pt's PCP is with [facility] & pt would like to switch PCP to [facility]. Contact # given to [facility] for Primary Care here at [facility] to set up new care ASAP please. Pt verbalized an understanding - no other questions at this time

## 2021-02-20 LAB — COVID-19, FLU A+B NAA
Influenza A, NAA: NOT DETECTED
Influenza B, NAA: NOT DETECTED
SARS-CoV-2, NAA: NOT DETECTED

## 2021-02-21 ENCOUNTER — Inpatient Hospital Stay (HOSPITAL_COMMUNITY)
Admission: RE | Admit: 2021-02-21 | Discharge: 2021-02-21 | Disposition: A | Discharge status: Home / Self Care | Payer: Medicaid Other | Source: Ambulatory Visit

## 2021-02-21 NOTE — Progress Notes (Signed)
Patient called in stating she was due to come in for PAT appt at 3pm today. Patient states she is not feeling well and has a cough. Went to urgent care three days ago and was COVID negative. Patient states she was told it was probably viral and instructed to make GYN aware since patient is to have hysterectomy on July 6. Instructed patient that we could make her PAT appt a SDW call since she was not feeling well. Patient questioned when to stop taking Xarelto. Instructed patient to call Dr. Kennon Rounds for further instructions about Xarelto and to make aware of symptoms. Verbalized understanding.

## 2021-02-27 ENCOUNTER — Ambulatory Visit (HOSPITAL_COMMUNITY): Admission: RE | Admit: 2021-02-27 | Payer: Medicaid Other | Source: Home / Self Care | Admitting: Family Medicine

## 2021-02-27 ENCOUNTER — Encounter (HOSPITAL_COMMUNITY): Admission: RE | Payer: Self-pay | Source: Home / Self Care

## 2021-02-27 SURGERY — HYSTERECTOMY, VAGINAL
Anesthesia: Choice | Laterality: Bilateral

## 2022-01-07 ENCOUNTER — Encounter: Payer: Self-pay | Admitting: Obstetrics and Gynecology

## 2022-01-07 ENCOUNTER — Ambulatory Visit (INDEPENDENT_AMBULATORY_CARE_PROVIDER_SITE_OTHER): Payer: Medicaid Other | Admitting: Obstetrics and Gynecology

## 2022-01-07 VITALS — BP 135/92 | HR 93 | Wt 246.0 lb

## 2022-01-07 DIAGNOSIS — Z0000 Encounter for general adult medical examination without abnormal findings: Secondary | ICD-10-CM

## 2022-01-07 DIAGNOSIS — Z01411 Encounter for gynecological examination (general) (routine) with abnormal findings: Secondary | ICD-10-CM | POA: Diagnosis not present

## 2022-01-07 DIAGNOSIS — Z01419 Encounter for gynecological examination (general) (routine) without abnormal findings: Secondary | ICD-10-CM

## 2022-01-07 DIAGNOSIS — N920 Excessive and frequent menstruation with regular cycle: Secondary | ICD-10-CM | POA: Diagnosis not present

## 2022-01-07 NOTE — Progress Notes (Signed)
Pt c/o heavy and painful periods

## 2022-01-07 NOTE — Progress Notes (Signed)
GYNECOLOGY ANNUAL PREVENTATIVE CARE ENCOUNTER NOTE  History:     Judy Welch is a 46 y.o. [id number] female here for a routine annual gynecologic exam.  Current complaints: continues to have heavy bleeding.   Denies abnormal discharge, pelvic pain, problems with intercourse or other gynecologic concerns.   Continues to have heavy menstrual cycles. She is very limited on what medications she can take for this given her history of PE.  Hysterectomy was scheduled for July 2022, she was diagnosed and admitted with pneumonia and had to cancel her surgery . She then had cardiac surgery for tricuspid regurgitation.   Gynecologic History No LMP recorded. (Menstrual status: Oral contraceptives). Contraception: none Last Pap: 2022. Result was normal with negative HPV Last Mammogram: NA- ordered  Last Colonoscopy: NA  Obstetric History OB History  Gravida Para Term Preterm AB Living  0 0 0 0 0 0  SAB IAB Ectopic Multiple Live Births  0 0 0 0 0    Past Medical History:  Diagnosis Date   Anemia    Anxiety    Breast disorder    left breast lump   Depression    Endocarditis    Fibrillation, atrial (HCC)    Insomnia disorder    Mental disorder    anxiety   Ovarian cyst    PTSD (post-traumatic stress disorder)    Septic shock (HCC)    SVT (supraventricular tachycardia) (HCC)    Tricuspid valve disorder    Vaginal Pap smear, abnormal     Past Surgical History:  Procedure Laterality Date   CARDIAC ELECTROPHYSIOLOGY MAPPING AND ABLATION     CARDIAC ELECTROPHYSIOLOGY STUDY AND ABLATION     HIP SURGERY     TRICUSPID VALVE REPLACEMENT      Current Outpatient Medications on File Prior to Visit  Medication Sig Dispense Refill   cyclobenzaprine (FLEXERIL) 10 MG tablet Take by mouth.     docusate sodium (COLACE) 100 MG capsule Take 1 capsule (100 mg total) by mouth 2 (two) times daily as needed. 30 capsule 2   dofetilide (TIKOSYN) 250 MCG capsule Take by mouth.     furosemide  (LASIX) 20 MG tablet Take 1 tablet (20 mg total) by mouth as needed. 30 tablet    hydrOXYzine (VISTARIL) 100 MG capsule Take by mouth.     LORazepam (ATIVAN) 1 MG tablet Take by mouth.     omeprazole (PRILOSEC OTC) 20 MG tablet Take 2 tablets (40 mg total) by mouth daily. 30 tablet 3   potassium chloride (MICRO-K) 10 MEQ CR capsule Take 1 capsule (10 mEq total) by mouth as needed.     pregabalin (LYRICA) 200 MG capsule Take 1 capsule (200 mg total) by mouth in the morning, at noon, and at bedtime.     pyridOXINE (VITAMIN B-6) 50 MG tablet Take 1 tablet (50 mg total) by mouth daily.     rivaroxaban (XARELTO) 20 MG TABS tablet Take 1 tablet (20 mg total) by mouth daily with supper. 30 tablet    sertraline (ZOLOFT) 100 MG tablet Take 2 tablets (200 mg total) by mouth daily.     zolpidem (AMBIEN) 10 MG tablet Take by mouth.     No current facility-administered medications on file prior to visit.    Allergies  Allergen Reactions   Sulfa Antibiotics Other (See Comments)    Can't take with DOFETILIDE/ TIKOSYN Can't take with DOFETILIDE/ TIKOSYN   Sulfamethoxazole-Trimethoprim     Social History:  reports that she has  never smoked. She has never used smokeless tobacco. She reports that she does not drink alcohol and does not use drugs.  Family History  Problem Relation Age of Onset   Diabetes Father    Hypertension Father    Cancer Mother        bladder   Cancer Maternal Aunt        breast   Stroke Neg Hx     The following portions of the patient's history were reviewed and updated as appropriate: allergies, current medications, past family history, past medical history, past social history, past surgical history and problem list.  Review of Systems Pertinent items noted in HPI and remainder of comprehensive ROS otherwise negative.  Physical Exam:  BP (!) 135/92   Pulse 93   Wt 246 lb (111.6 kg)   BMI 37.40 kg/m  CONSTITUTIONAL: Well-developed, well-nourished female in no acute  distress.  HENT:  Normocephalic, atraumatic, External right and left ear normal.  EYES: Conjunctivae and EOM are normal. Pupils are equal, round, and reactive to light. No scleral icterus.  NECK: Normal range of motion, supple, no masses.  Normal thyroid.  SKIN: Skin is warm and dry. No rash noted. Not diaphoretic. No erythema. No pallor. MUSCULOSKELETAL: Normal range of motion. No tenderness.  No cyanosis, clubbing, or edema. NEUROLOGIC: Alert and oriented to person, place, and time. Normal reflexes, muscle tone coordination.  PSYCHIATRIC: Normal mood and affect. Normal behavior. Normal judgment and thought content. CARDIOVASCULAR: Normal heart rate noted, regular rhythm RESPIRATORY: Clear to auscultation bilaterally. Effort and breath sounds normal, no problems with respiration noted. BREASTS: Symmetric in size. No masses, tenderness, skin changes, nipple drainage, or lymphadenopathy bilaterally. Performed in the presence of a chaperone. ABDOMEN: Soft, no distention noted.  No tenderness, rebound or guarding.  PELVIC: Normal uterine size, no other palpable masses, no uterine or adnexal tenderness.  Performed in the presence of a chaperone.   Assessment and Plan:   1. Women's annual routine gynecological examination  - MM Digital Screening; Future  - Pap not due - Schedule appointment with Dr. Elly Welch or Dr. Kennon Welch to discuss surgery.  - again discussed progesterone only as an option for heavy menstrual cycles. She is unsure about this and wants to do more research on her own.   Mammogram scheduled     Judy Welch, Judy Welch, Judy Welch for [employer], [place]

## 2022-01-09 ENCOUNTER — Telehealth: Payer: Self-pay

## 2022-01-09 NOTE — Telephone Encounter (Signed)
Pt was called on 01-09-2022 by kg  pt stated she has already made appt for mammography my chart   pt  was made aware she would be responsible for payment of mammography .  Scholarship does not pay for mammography  at the location of appt.  Pt says she didn't want to come to [place] location.  She was going to go thur [employer] or [employer] .  If she change  her mind she would contact the [place] mammography scholarship fund

## 2022-01-13 ENCOUNTER — Other Ambulatory Visit: Payer: Self-pay

## 2022-01-13 DIAGNOSIS — N920 Excessive and frequent menstruation with regular cycle: Secondary | ICD-10-CM

## 2022-01-13 MED ORDER — SLYND 4 MG PO TABS: 1.0000 | ORAL_TABLET | Freq: Every day | ORAL | 6 refills | Status: DC

## 2022-01-16 DIAGNOSIS — Z1231 Encounter for screening mammogram for malignant neoplasm of breast: Secondary | ICD-10-CM

## 2022-01-30 ENCOUNTER — Ambulatory Visit (INDEPENDENT_AMBULATORY_CARE_PROVIDER_SITE_OTHER): Payer: Self-pay | Admitting: Family Medicine

## 2022-01-30 ENCOUNTER — Encounter: Payer: Self-pay | Admitting: Family Medicine

## 2022-01-30 VITALS — BP 133/89 | HR 87 | Ht 68.0 in | Wt 248.0 lb

## 2022-01-30 DIAGNOSIS — N939 Abnormal uterine and vaginal bleeding, unspecified: Secondary | ICD-10-CM

## 2022-01-30 NOTE — Assessment & Plan Note (Signed)
Booked for [facility] with bilateral salpingectomy. For cards clearance and Xarelto mgmt peri-op. She has had several successful surgeries since her PE. The risks of surgery are again reviewed. Risks include but are not limited to bleeding, infection, injury to surrounding structures, including bowel, bladder and ureters, blood clots, and death.  Likelihood of success is high.

## 2022-01-30 NOTE — Progress Notes (Signed)
   Subjective:    Patient ID: Judy Welch is a 46 y.o. female presenting with Discuss Surgery  on 01/30/2022  HPI: Patient is a [id number] who has a complicated medical hx including sepsis with tricuspid endocarditis (sinus infection) and now s/p valve repair and septic emboli. On Xarelto due to A. Fib and the PE burden. Seen and booked for [facility] in 7/22--had to cancel as she was admitted with pneumonia. Now ready for surgery. Still having pain and heavy bleeding. Cylce was 11-12 days on the [facility]. Having nipple tenderness related to this. Anemia due to heavy vaginal bleeding. Has declined other interventions. EMB WNL 4/22. U/S at that time showd Uterus approx. 8.3 x 3.6.4.7 cm  Review of Systems  Constitutional:  Negative for chills and fever.  Respiratory:  Negative for shortness of breath.   Cardiovascular:  Negative for chest pain.  Gastrointestinal:  Negative for abdominal pain, nausea and vomiting.  Genitourinary:  Positive for vaginal bleeding. Negative for dysuria.  Skin:  Negative for rash.      Objective:    Wt 248 lb (112.5 kg)   BMI 37.71 kg/m  Physical Exam Exam conducted with a chaperone present.  Constitutional:      General: She is not in acute distress.    Appearance: She is well-developed.  HENT:     Head: Normocephalic and atraumatic.  Eyes:     General: No scleral icterus. Cardiovascular:     Rate and Rhythm: Normal rate.  Pulmonary:     Effort: Pulmonary effort is normal.  Abdominal:     Palpations: Abdomen is soft.  Musculoskeletal:     Cervical back: Neck supple.  Skin:    General: Skin is warm and dry.  Neurological:     Mental Status: She is alert and oriented to person, place, and time.         Assessment & Plan:   Problem List Items Addressed This Visit       Unprioritized   Abnormal uterine bleeding (AUB) - Primary    Booked for [facility] with bilateral salpingectomy. For cards clearance and Xarelto mgmt peri-op. She has had several successful  surgeries since her PE. The risks of surgery are again reviewed. Risks include but are not limited to bleeding, infection, injury to surrounding structures, including bowel, bladder and ureters, blood clots, and death.  Likelihood of success is high.      Needs to apply for charity care  Return in about 3 months (around 05/02/2022) for postop check.  [name], MD 01/30/2022 2:51 PM

## 2022-02-04 ENCOUNTER — Encounter: Payer: Self-pay | Admitting: *Deleted

## 2022-02-11 ENCOUNTER — Encounter: Payer: Self-pay | Admitting: *Deleted

## 2022-02-11 NOTE — Progress Notes (Cosign Needed)
Received message from Dr Radford Pax with advise for stopping Xarelto prior to pt's hysterestomy with Dr Damita Dunnings

## 2022-02-27 ENCOUNTER — Encounter (HOSPITAL_COMMUNITY)
Admission: RE | Admit: 2022-02-27 | Discharge: 2022-02-27 | Disposition: A | Discharge status: Home / Self Care | Payer: Medicaid Other | Source: Ambulatory Visit | Attending: Family Medicine | Admitting: Family Medicine

## 2022-02-27 ENCOUNTER — Other Ambulatory Visit: Payer: Self-pay

## 2022-02-27 ENCOUNTER — Encounter (HOSPITAL_COMMUNITY): Payer: Self-pay

## 2022-02-27 VITALS — BP 126/70 | HR 88 | Temp 97.7°F | Resp 18 | Ht 68.0 in | Wt 251.4 lb

## 2022-02-27 DIAGNOSIS — Z789 Other specified health status: Secondary | ICD-10-CM | POA: Insufficient documentation

## 2022-02-27 DIAGNOSIS — Z8616 Personal history of COVID-19: Secondary | ICD-10-CM | POA: Insufficient documentation

## 2022-02-27 DIAGNOSIS — I081 Rheumatic disorders of both mitral and tricuspid valves: Secondary | ICD-10-CM | POA: Insufficient documentation

## 2022-02-27 DIAGNOSIS — N939 Abnormal uterine and vaginal bleeding, unspecified: Secondary | ICD-10-CM | POA: Insufficient documentation

## 2022-02-27 DIAGNOSIS — Z01812 Encounter for preprocedural laboratory examination: Secondary | ICD-10-CM | POA: Insufficient documentation

## 2022-02-27 DIAGNOSIS — Z79899 Other long term (current) drug therapy: Secondary | ICD-10-CM | POA: Insufficient documentation

## 2022-02-27 DIAGNOSIS — I480 Paroxysmal atrial fibrillation: Secondary | ICD-10-CM | POA: Insufficient documentation

## 2022-02-27 HISTORY — DX: Alcohol abuse, uncomplicated: F10.10

## 2022-02-27 HISTORY — DX: Pneumonia, unspecified organism: J18.9

## 2022-02-27 HISTORY — DX: Septic arterial embolism: I76

## 2022-02-27 HISTORY — DX: Pyogenic arthritis, unspecified: M00.9

## 2022-02-27 HISTORY — DX: Cardiac murmur, unspecified: R01.1

## 2022-02-27 HISTORY — DX: Unspecified atrial fibrillation: I48.91

## 2022-02-27 HISTORY — DX: Other pulmonary embolism without acute cor pulmonale: I26.99

## 2022-02-27 LAB — COMPREHENSIVE METABOLIC PANEL
ALT: 22 U/L (ref 0–44)
AST: 21 U/L (ref 15–41)
Albumin: 3.8 g/dL (ref 3.5–5.0)
Alkaline Phosphatase: 57 U/L (ref 38–126)
Anion gap: 6 (ref 5–15)
BUN: 13 mg/dL (ref 6–20)
CO2: 22 mmol/L (ref 22–32)
Calcium: 9.1 mg/dL (ref 8.9–10.3)
Chloride: 111 mmol/L (ref 98–111)
Creatinine, Ser: 0.88 mg/dL (ref 0.44–1.00)
GFR, Estimated: >60 mL/min (ref >60)
Glucose, Bld: 102 mg/dL — ABNORMAL HIGH (ref 70–99)
Potassium: 4.5 mmol/L (ref 3.5–5.1)
Sodium: 139 mmol/L (ref 135–145)
Total Bilirubin: 0.6 mg/dL (ref 0.3–1.2)
Total Protein: 6.2 g/dL — ABNORMAL LOW (ref 6.5–8.1)

## 2022-02-27 LAB — CBC
HCT: 36.8 % (ref 36.0–46.0)
Hemoglobin: 11.8 g/dL — ABNORMAL LOW (ref 12.0–15.0)
MCH: 27.2 pg (ref 26.0–34.0)
MCHC: 32.1 g/dL (ref 30.0–36.0)
MCV: 84.8 fL (ref 80.0–100.0)
Platelets: 221 10*3/uL (ref 150–400)
RBC: 4.34 MIL/uL (ref 3.87–5.11)
RDW: 17.7 % — ABNORMAL HIGH (ref 11.5–15.5)
WBC: 5.4 10*3/uL (ref 4.0–10.5)
nRBC: 0 % (ref 0.0–0.2)

## 2022-02-27 LAB — TYPE AND SCREEN
ABO/RH(D): A POS
Antibody Screen: NEGATIVE

## 2022-02-27 NOTE — Pre-Procedure Instructions (Signed)
Surgical Instructions    Your procedure is scheduled on Tuesday, July 11.  Report to [facility] Main Entrance "A" at 10:00 A.M., then check in with the Admitting office.  Call this number if you have problems the morning of surgery:  [phone]   If you have any questions prior to your surgery date call [phone]: Open Monday-Friday 8am-4pm    Remember:  Do not eat after midnight the night before your surgery  You may drink clear liquids until 9:00AM the morning of your surgery.   Clear liquids allowed are: Water, Non-Citrus Juices (without pulp), Carbonated Beverages, Clear Tea, Black Coffee ONLY (NO MILK, CREAM OR POWDERED CREAMER of any kind), and Gatorade    Take these medicines the morning of surgery with A SIP OF WATER:  buPROPion (WELLBUTRIN XL)  dofetilide (TIKOSYN)  Drospirenone (SLYND) omeprazole (PRILOSEC OTC)  pregabalin (LYRICA) rosuvastatin (CRESTOR) sertraline (ZOLOFT)  cyclobenzaprine (FLEXERIL) if needed fluticasone (FLONASE)  if needed hydrOXYzine (ATARAX) if needed LORazepam (ATIVAN)  if needed  Follow your surgeon's instructions on when to stop Rivaroxaban (Xarelto) If no instructions were given by your surgeon then you will need to call the office to get those instructions.     As of today, STOP taking any Aspirin (unless otherwise instructed by your surgeon) Aleve, Naproxen, Ibuprofen, Motrin, Advil, Goody's, BC's, all herbal medications, fish oil, and all vitamins.            [facility] is not responsible for any belongings or valuables.   Do NOT Smoke (Tobacco/Vaping)  24 hours prior to your procedure  If you use a CPAP at night, you may bring your mask for your overnight stay.   Contacts, glasses, hearing aids, dentures or partials may not be worn into surgery, please bring cases for these belongings   For patients admitted to the hospital, discharge time will be determined by your treatment team.   Patients discharged the day of surgery  will not be allowed to drive home, and someone needs to stay with them for 24 hours.   SURGICAL WAITING ROOM VISITATION Patients having surgery or a procedure in a hospital may have two support people. Children under the age of 62 must have an adult with them who is not the patient. They may stay in the waiting area during the procedure and may switch out with other visitors. If the patient needs to stay at the hospital during part of their recovery, the visitor guidelines for inpatient rooms apply.  Please refer to the [facility] website for the visitor guidelines for Inpatients (after your surgery is over and you are in a regular room).       Special instructions:    Oral Hygiene is also important to reduce your risk of infection.  Remember - BRUSH YOUR TEETH THE MORNING OF SURGERY WITH YOUR REGULAR TOOTHPASTE   [facility]- Preparing For Surgery  Before surgery, you can play an important role. Because skin is not sterile, your skin needs to be as free of germs as possible. You can reduce the number of germs on your skin by washing with CHG (chlorahexidine gluconate) Soap before surgery.  CHG is an antiseptic cleaner which kills germs and bonds with the skin to continue killing germs even after washing.     Please do not use if you have an allergy to CHG or antibacterial soaps. If your skin becomes reddened/irritated stop using the CHG.  Do not shave (including legs and underarms) for at least 48 hours prior  to first CHG shower. It is OK to shave your face.  Please follow these instructions carefully.     Shower the NIGHT BEFORE SURGERY and the MORNING OF SURGERY with CHG Soap.   If you chose to wash your hair, wash your hair first as usual with your normal shampoo. After you shampoo, rinse your hair and body thoroughly to remove the shampoo.  Then [employer] and genitals (private parts) with your normal soap and rinse thoroughly to remove soap.  After that Use CHG Soap as you would  any other liquid soap. You can apply CHG directly to the skin and wash gently with a scrungie or a clean washcloth.   Apply the CHG Soap to your body ONLY FROM THE NECK DOWN.  Do not use on open wounds or open sores. Avoid contact with your eyes, ears, mouth and genitals (private parts). Wash Face and genitals (private parts)  with your normal soap.   Wash thoroughly, paying special attention to the area where your surgery will be performed.  Thoroughly rinse your body with warm water from the neck down.  DO NOT shower/wash with your normal soap after using and rinsing off the CHG Soap.  Pat yourself dry with a CLEAN TOWEL.  Wear CLEAN PAJAMAS to bed the night before surgery  Place CLEAN SHEETS on your bed the night before your surgery  DO NOT SLEEP WITH PETS.   Day of Surgery:  Take a shower with CHG soap. Wear Clean/Comfortable clothing the morning of surgery Do not wear jewelry or makeup Do not wear lotions, powders, perfumes/colognes, or deodorant. Do not shave 48 hours prior to surgery.  Men may shave face and neck. Do not bring valuables to the hospital. Do not wear nail polish, gel polish, artificial nails, or any other type of covering on natural nails (fingers and toes) If you have artificial nails or gel coating that need to be removed by a nail salon, please have this removed prior to surgery. Artificial nails or gel coating may interfere with anesthesia's ability to adequately monitor your vital signs. Remember to brush your teeth WITH YOUR REGULAR TOOTHPASTE.    If you received a COVID test during your pre-op visit, it is requested that you wear a mask when out in public, stay away from anyone that may not be feeling well, and notify your surgeon if you develop symptoms. If you have been in contact with anyone that has tested positive in the last 10 days, please notify your surgeon.    Please read over the following fact sheets that you were given.

## 2022-02-27 NOTE — Progress Notes (Signed)
PCP - Julie Martinique, NP- [place] Cardiologist - Dr Clovis Riley- last office note and EKG from 11/27/21 requested  PPM/ICD - denies Chest x-ray - 01/01/22- c.e EKG - requested - 12/26/21 in care everywhere Stress Test - denies ECHO - TTE- 11/27/20 Cardiac Cath - 05/01/21- care everywhere  Sleep Study - denies Blood Thinner Instructions: Pt reports she was instructed to stop Xarelto 2 days prior to surgery Aspirin Instructions: N/A  ERAS Protcol -eras with no drink order COVID TEST- N/A   Anesthesia review:   -see heart history- pt reports she feels well currently. 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 Pt states she was scheduled for cardioversion in April 2023 d/t Afib- pt reports on arrival for procedure, she was in NSR- EKG requested from this encounter. Pt states she can feel when she is in Afib, and has not felt this since then. 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  -Pt reports she has been having increased bleeding with her periods and was started on Slynd to help this. Pt reports she started her period this week. Pt verbalized understanding to call surgeon/provider if bleeding increases/heavy bleeding noted. CBC drawn at PAT today. Pt verbalized understanding.   Patient denies shortness of breath, fever, cough and chest pain at PAT appointment   All instructions explained to the patient, with a verbal understanding of the material. Patient agrees to go over the instructions while at home for a better understanding. The opportunity to ask questions was provided.

## 2022-02-28 NOTE — Anesthesia Preprocedure Evaluation (Signed)
Anesthesia Evaluation Anesthesia Physical Anesthesia Plan  ASA:   Anesthesia Plan:    Post-op Pain Management:    Induction:   PONV Risk Score and Plan:   Airway Management Planned:   Additional Equipment:   Intra-op Plan:   Post-operative Plan:   Informed Consent:   Plan Discussed with:   Anesthesia Plan Comments: (PAT note by Karoline Caldwell, PA-C: Follows with cardiologist Dr. 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  Cath 05/01/2021 with widely patent coronaries.  She is maintained on Xarelto for paroxysmal A-fib.  She was previously scheduled for cardioversion April 2023, however was in sinus rhythm and presented.  TEE at that time showed EF 55 to 60%, mild to moderate regurgitation of bioprosthetic tricuspid valve, mild to moderate mitral regurgitation, normal appearance of surgically ligated left atrial appendage. Dr. Radford Pax stated it was okay for pt to hold Xarelto for surgery. Pt reports she was advised to hold for 2d.   History of EtOH abuse.  Reports none currently.  Preop labs reviewed, mild anemia with hemoglobin 11.8, otherwise unremarkable.  EKG 11/14/2021 (copy on chart): Atrial flutter/fibrillation, rate 81, low voltage precordial leads, incomplete right bundle branch block.  TEE 11/27/2021 (Care Everywhere): Left Ventricle  EF: 55-60%.   Right Ventricle  Grossly dilated.   Left Atrium  The left atrial appendage has been surgically closed and the repair appears to be normal. s/p ligation of left atrial appendage (99m AtriCure clip 05/2021)   Right Atrium  Grossly dilated.   Mitral Valve  Mitral valve structure is normal. There is mild to moderate regurgitation.   Tricuspid Valve  There  is a well seated 29 mm Edwards Perimount magna mitral ease bovine pericardial valve in the tricuspid position (implanted 05/2021). There is mild-moderate regurgitation.   Aortic Valve  The aortic valve is tricuspid. There is no regurgitation.   Pulmonic Valve  Pulmonic valve structure is normal. No regurgitation present on the pulmonic valve   Ascending Aorta  Theaortic root is normal in size.   Pericardium  There is no pericardial effusion.   Cath 05/01/2021 (Care Everywhere): CONCLUSIONS:  1. Successful transfemoral cardiac catheterization  2. Widely patent coronary arteries  3. Upper limit of normal for intracardiac filling pressures  )        Anesthesia Quick Evaluation

## 2022-02-28 NOTE — Progress Notes (Signed)
Anesthesia Chart Review:  Follows with cardiologist Dr. 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  Cath 05/01/2021 with widely patent coronaries.  She is maintained on Xarelto for paroxysmal A-fib.  She was previously scheduled for cardioversion April 2023, however was in sinus rhythm and presented.  TEE at that time showed EF 55 to 60%, mild to moderate regurgitation of bioprosthetic tricuspid valve, mild to moderate mitral regurgitation, normal appearance of surgically ligated left atrial appendage. Dr. Radford Pax stated it was okay for pt to hold Xarelto for surgery. Pt reports she was advised to hold for 2d.   History of EtOH abuse.  Reports none currently.  Preop labs reviewed, mild anemia with hemoglobin 11.8, otherwise unremarkable.  EKG 11/14/2021 (copy on chart): Atrial flutter/fibrillation, rate 81, low voltage precordial leads, incomplete right bundle branch block.  TEE 11/27/2021 (Care Everywhere): Left Ventricle  EF: 55-60%.   Right Ventricle  Grossly dilated.   Left Atrium  The left atrial appendage has been surgically closed and the repair appears to be normal. s/p ligation of left atrial appendage (45m AtriCure clip 05/2021)   Right Atrium  Grossly dilated.   Mitral Valve  Mitral valve structure is normal. There is mild to moderate regurgitation.   Tricuspid Valve  There is a well seated 29 mm Edwards Perimount magna mitral ease bovine pericardial valve in the tricuspid position (implanted 05/2021). There is mild-moderate regurgitation.   Aortic Valve  The aortic valve is tricuspid. There is no regurgitation.   Pulmonic Valve  Pulmonic valve structure is normal. No regurgitation present on the pulmonic  valve   Ascending Aorta  The aortic root is normal in size.   Pericardium  There is no pericardial effusion.    Cath 05/01/2021 (Care Everywhere): CONCLUSIONS:  1. Successful transfemoral cardiac catheterization  2. Widely patent coronary arteries  3. Upper limit of normal for intracardiac filling pressures    JWynonia MustyMCbcc Pain [facility] And Surgery CenterShort Stay Center/Anesthesiology Phone [phone]/02/2022 1:22 PM

## 2022-03-04 ENCOUNTER — Encounter (HOSPITAL_COMMUNITY): Payer: Self-pay | Admitting: Family Medicine

## 2022-03-04 ENCOUNTER — Encounter (HOSPITAL_COMMUNITY)
Admission: RE | Disposition: A | Discharge status: Home / Self Care | Payer: Self-pay | Source: Home / Self Care | Attending: Family Medicine

## 2022-03-04 ENCOUNTER — Ambulatory Visit (HOSPITAL_BASED_OUTPATIENT_CLINIC_OR_DEPARTMENT_OTHER): Payer: Self-pay

## 2022-03-04 ENCOUNTER — Ambulatory Visit (HOSPITAL_COMMUNITY): Payer: Self-pay | Admitting: Physician Assistant

## 2022-03-04 ENCOUNTER — Ambulatory Visit (HOSPITAL_COMMUNITY)
Admission: RE | Admit: 2022-03-04 | Discharge: 2022-03-05 | Disposition: A | Discharge status: Home / Self Care | Payer: Self-pay | Attending: Family Medicine | Admitting: Family Medicine

## 2022-03-04 ENCOUNTER — Other Ambulatory Visit: Payer: Self-pay

## 2022-03-04 DIAGNOSIS — N939 Abnormal uterine and vaginal bleeding, unspecified: Secondary | ICD-10-CM

## 2022-03-04 DIAGNOSIS — I4891 Unspecified atrial fibrillation: Secondary | ICD-10-CM

## 2022-03-04 DIAGNOSIS — Z86711 Personal history of pulmonary embolism: Secondary | ICD-10-CM | POA: Insufficient documentation

## 2022-03-04 DIAGNOSIS — Z6838 Body mass index (BMI) 38.0-38.9, adult: Secondary | ICD-10-CM | POA: Insufficient documentation

## 2022-03-04 DIAGNOSIS — Z7901 Long term (current) use of anticoagulants: Secondary | ICD-10-CM | POA: Insufficient documentation

## 2022-03-04 DIAGNOSIS — Z90710 Acquired absence of both cervix and uterus: Secondary | ICD-10-CM | POA: Diagnosis present

## 2022-03-04 DIAGNOSIS — D500 Iron deficiency anemia secondary to blood loss (chronic): Secondary | ICD-10-CM | POA: Insufficient documentation

## 2022-03-04 DIAGNOSIS — N8003 Adenomyosis of the uterus: Secondary | ICD-10-CM

## 2022-03-04 HISTORY — PX: VAGINAL HYSTERECTOMY: SHX2639

## 2022-03-04 LAB — CBC
HCT: 37.3 % (ref 36.0–46.0)
Hemoglobin: 12.3 g/dL (ref 12.0–15.0)
MCH: 27.3 pg (ref 26.0–34.0)
MCHC: 33 g/dL (ref 30.0–36.0)
MCV: 82.7 fL (ref 80.0–100.0)
Platelets: 197 10*3/uL (ref 150–400)
RBC: 4.51 MIL/uL (ref 3.87–5.11)
RDW: 16.8 % — ABNORMAL HIGH (ref 11.5–15.5)
WBC: 8.6 10*3/uL (ref 4.0–10.5)
nRBC: 0 % (ref 0.0–0.2)

## 2022-03-04 LAB — ABO/RH: ABO/RH(D): A POS

## 2022-03-04 LAB — CREATININE, SERUM
Creatinine, Ser: 0.97 mg/dL (ref 0.44–1.00)
GFR, Estimated: >60 mL/min (ref >60)

## 2022-03-04 LAB — POCT PREGNANCY, URINE: Preg Test, Ur: NEGATIVE

## 2022-03-04 SURGERY — HYSTERECTOMY, VAGINAL
Anesthesia: General | Site: Perineum | Laterality: Bilateral

## 2022-03-04 MED ORDER — ACETAMINOPHEN 500 MG PO TABS
1000.0000 mg | ORAL_TABLET | Freq: Four times a day (QID) | ORAL | Status: DC
  Administered 2022-03-04 – 2022-03-05 (×3): 1000 mg via ORAL
  Filled 2022-03-04 (×3): qty 2

## 2022-03-04 MED ORDER — ONDANSETRON HCL 4 MG PO TABS: 4.0000 mg | ORAL_TABLET | Freq: Four times a day (QID) | ORAL | Status: DC | PRN

## 2022-03-04 MED ORDER — BUPROPION HCL ER (XL) 150 MG PO TB24: 300.0000 mg | ORAL_TABLET | ORAL | Status: DC

## 2022-03-04 MED ORDER — ONDANSETRON HCL 4 MG/2ML IJ SOLN
INTRAMUSCULAR | Status: DC | PRN
  Administered 2022-03-04: 4 mg via INTRAVENOUS

## 2022-03-04 MED ORDER — ROCURONIUM BROMIDE 10 MG/ML (PF) SYRINGE
PREFILLED_SYRINGE | INTRAVENOUS | Status: AC
  Filled 2022-03-04: qty 10

## 2022-03-04 MED ORDER — ENOXAPARIN SODIUM 40 MG/0.4ML IJ SOSY
40.0000 mg | PREFILLED_SYRINGE | INTRAMUSCULAR | Status: DC
  Administered 2022-03-05: 40 mg via SUBCUTANEOUS
  Filled 2022-03-04: qty 0.4

## 2022-03-04 MED ORDER — POLYETHYLENE GLYCOL 3350 17 G PO PACK: 17.0000 g | PACK | Freq: Every day | ORAL | Status: DC | PRN

## 2022-03-04 MED ORDER — LACTATED RINGERS IV SOLN: INTRAVENOUS | Status: DC

## 2022-03-04 MED ORDER — ORAL CARE MOUTH RINSE: 15.0000 mL | Freq: Once | OROMUCOSAL | Status: AC

## 2022-03-04 MED ORDER — MENTHOL 3 MG MT LOZG: 1.0000 | LOZENGE | OROMUCOSAL | Status: DC | PRN

## 2022-03-04 MED ORDER — SUCCINYLCHOLINE CHLORIDE 200 MG/10ML IV SOSY: PREFILLED_SYRINGE | INTRAVENOUS | Status: DC | PRN

## 2022-03-04 MED ORDER — ONDANSETRON HCL 4 MG/2ML IJ SOLN: 4.0000 mg | Freq: Four times a day (QID) | INTRAMUSCULAR | Status: DC | PRN

## 2022-03-04 MED ORDER — BUPROPION HCL ER (XL) 150 MG PO TB24
150.0000 mg | ORAL_TABLET | Freq: Every day | ORAL | Status: DC
Start: 2022-03-05 — End: 2022-03-05
  Administered 2022-03-05: 150 mg via ORAL
  Filled 2022-03-04: qty 1

## 2022-03-04 MED ORDER — PANTOPRAZOLE SODIUM 40 MG PO TBEC
80.0000 mg | DELAYED_RELEASE_TABLET | Freq: Every day | ORAL | Status: DC
  Administered 2022-03-05: 80 mg via ORAL
  Filled 2022-03-04: qty 2

## 2022-03-04 MED ORDER — MIDAZOLAM HCL 2 MG/2ML IJ SOLN
INTRAMUSCULAR | Status: DC | PRN
  Administered 2022-03-04: 2 mg via INTRAVENOUS

## 2022-03-04 MED ORDER — LIDOCAINE-EPINEPHRINE 1 %-1:100000 IJ SOLN
INTRAMUSCULAR | Status: AC
  Filled 2022-03-04: qty 1

## 2022-03-04 MED ORDER — PREGABALIN 100 MG PO CAPS
200.0000 mg | ORAL_CAPSULE | Freq: Three times a day (TID) | ORAL | Status: DC
Start: 2022-03-04 — End: 2022-03-05
  Administered 2022-03-04 – 2022-03-05 (×3): 200 mg via ORAL
  Filled 2022-03-04 (×3): qty 2

## 2022-03-04 MED ORDER — HYDROMORPHONE HCL 1 MG/ML IJ SOLN
0.2000 mg | INTRAMUSCULAR | Status: DC | PRN
  Administered 2022-03-04 – 2022-03-05 (×7): 0.6 mg via INTRAVENOUS
  Filled 2022-03-04 (×7): qty 1

## 2022-03-04 MED ORDER — ONDANSETRON HCL 4 MG/2ML IJ SOLN
INTRAMUSCULAR | Status: AC
  Filled 2022-03-04: qty 2

## 2022-03-04 MED ORDER — ACETAMINOPHEN 500 MG PO TABS
ORAL_TABLET | ORAL | Status: AC
  Administered 2022-03-04: 1000 mg via ORAL
  Filled 2022-03-04: qty 2

## 2022-03-04 MED ORDER — DEXTROSE IN LACTATED RINGERS 5 % IV SOLN: INTRAVENOUS | Status: DC

## 2022-03-04 MED ORDER — ONDANSETRON HCL 4 MG/2ML IJ SOLN
4.0000 mg | Freq: Once | INTRAMUSCULAR | Status: DC | PRN
Start: 2022-03-04 — End: 2022-03-04

## 2022-03-04 MED ORDER — ROSUVASTATIN CALCIUM 5 MG PO TABS
10.0000 mg | ORAL_TABLET | Freq: Every morning | ORAL | Status: DC
  Administered 2022-03-05: 10 mg via ORAL
  Filled 2022-03-04: qty 2

## 2022-03-04 MED ORDER — HYDROMORPHONE HCL 1 MG/ML IJ SOLN
INTRAMUSCULAR | Status: AC
  Filled 2022-03-04: qty 1

## 2022-03-04 MED ORDER — SUGAMMADEX SODIUM 200 MG/2ML IV SOLN
INTRAVENOUS | Status: DC | PRN
  Administered 2022-03-04: 200 mg via INTRAVENOUS

## 2022-03-04 MED ORDER — POVIDONE-IODINE 10 % EX SWAB
2.0000 | Freq: Once | CUTANEOUS | Status: AC
Start: 2022-03-04 — End: 2022-03-04
  Administered 2022-03-04: 2 via TOPICAL

## 2022-03-04 MED ORDER — DOCUSATE SODIUM 100 MG PO CAPS: 100.0000 mg | ORAL_CAPSULE | Freq: Two times a day (BID) | ORAL | Status: DC

## 2022-03-04 MED ORDER — ZOLPIDEM TARTRATE 5 MG PO TABS
5.0000 mg | ORAL_TABLET | Freq: Every evening | ORAL | Status: DC | PRN
  Administered 2022-03-04: 5 mg via ORAL
  Filled 2022-03-04: qty 1

## 2022-03-04 MED ORDER — CEFAZOLIN SODIUM-DEXTROSE 2-4 GM/100ML-% IV SOLN
2.0000 g | INTRAVENOUS | Status: AC
  Administered 2022-03-04: 2 g via INTRAVENOUS

## 2022-03-04 MED ORDER — ROCURONIUM BROMIDE 10 MG/ML (PF) SYRINGE
PREFILLED_SYRINGE | INTRAVENOUS | Status: DC | PRN
  Administered 2022-03-04: 70 mg via INTRAVENOUS

## 2022-03-04 MED ORDER — LIDOCAINE-EPINEPHRINE 1 %-1:100000 IJ SOLN
INTRAMUSCULAR | Status: DC | PRN
  Administered 2022-03-04: 20 mL

## 2022-03-04 MED ORDER — SOD CITRATE-CITRIC ACID 500-334 MG/5ML PO SOLN
30.0000 mL | ORAL | Status: AC
  Administered 2022-03-04: 30 mL via ORAL

## 2022-03-04 MED ORDER — CHLORHEXIDINE GLUCONATE 0.12 % MT SOLN: 15.0000 mL | Freq: Once | OROMUCOSAL | Status: AC

## 2022-03-04 MED ORDER — BISACODYL 10 MG RE SUPP: 10.0000 mg | Freq: Every day | RECTAL | Status: DC | PRN

## 2022-03-04 MED ORDER — HYDROXYZINE HCL 25 MG PO TABS: 25.0000 mg | ORAL_TABLET | Freq: Every day | ORAL | Status: DC | PRN

## 2022-03-04 MED ORDER — IBUPROFEN 600 MG PO TABS: 600.0000 mg | ORAL_TABLET | Freq: Four times a day (QID) | ORAL | Status: DC

## 2022-03-04 MED ORDER — GABAPENTIN 300 MG PO CAPS: 300.0000 mg | ORAL_CAPSULE | ORAL | Status: AC

## 2022-03-04 MED ORDER — BUPROPION HCL ER (XL) 150 MG PO TB24: 150.0000 mg | ORAL_TABLET | ORAL | Status: DC

## 2022-03-04 MED ORDER — FENTANYL CITRATE (PF) 250 MCG/5ML IJ SOLN
INTRAMUSCULAR | Status: AC
  Filled 2022-03-04: qty 5

## 2022-03-04 MED ORDER — FENTANYL CITRATE (PF) 250 MCG/5ML IJ SOLN
INTRAMUSCULAR | Status: DC | PRN
  Administered 2022-03-04 (×2): 100 ug via INTRAVENOUS

## 2022-03-04 MED ORDER — PROPOFOL 10 MG/ML IV BOLUS
INTRAVENOUS | Status: AC
  Filled 2022-03-04: qty 20

## 2022-03-04 MED ORDER — DOFETILIDE 250 MCG PO CAPS
250.0000 ug | ORAL_CAPSULE | Freq: Two times a day (BID) | ORAL | Status: DC
  Administered 2022-03-04 – 2022-03-05 (×2): 250 ug via ORAL
  Filled 2022-03-04 (×2): qty 1

## 2022-03-04 MED ORDER — BUPROPION HCL ER (XL) 150 MG PO TB24
300.0000 mg | ORAL_TABLET | Freq: Every day | ORAL | Status: DC
Start: 2022-03-05 — End: 2022-03-05
  Administered 2022-03-05: 300 mg via ORAL
  Filled 2022-03-04: qty 2

## 2022-03-04 MED ORDER — CHLORHEXIDINE GLUCONATE 0.12 % MT SOLN
OROMUCOSAL | Status: AC
  Administered 2022-03-04: 15 mL via OROMUCOSAL
  Filled 2022-03-04: qty 15

## 2022-03-04 MED ORDER — LIDOCAINE 2% (20 MG/ML) 5 ML SYRINGE
INTRAMUSCULAR | Status: AC
  Filled 2022-03-04: qty 5

## 2022-03-04 MED ORDER — MIDAZOLAM HCL 2 MG/2ML IJ SOLN
INTRAMUSCULAR | Status: AC
  Filled 2022-03-04: qty 2

## 2022-03-04 MED ORDER — OXYCODONE HCL 5 MG/5ML PO SOLN: 5.0000 mg | Freq: Once | ORAL | Status: AC | PRN

## 2022-03-04 MED ORDER — LIDOCAINE 2% (20 MG/ML) 5 ML SYRINGE
INTRAMUSCULAR | Status: DC | PRN
  Administered 2022-03-04: 100 mg via INTRAVENOUS

## 2022-03-04 MED ORDER — ESTRADIOL 0.1 MG/GM VA CREA
TOPICAL_CREAM | VAGINAL | Status: AC
Start: 2022-03-04 — End: ?
  Filled 2022-03-04: qty 42.5

## 2022-03-04 MED ORDER — OXYCODONE HCL 5 MG PO TABS
5.0000 mg | ORAL_TABLET | ORAL | Status: DC | PRN
  Administered 2022-03-05 (×2): 10 mg via ORAL
  Filled 2022-03-04 (×2): qty 2

## 2022-03-04 MED ORDER — OMEPRAZOLE MAGNESIUM 20 MG PO TBEC
40.0000 mg | DELAYED_RELEASE_TABLET | Freq: Every day | ORAL | Status: DC
Start: 2022-03-04 — End: 2022-03-04

## 2022-03-04 MED ORDER — PROPOFOL 500 MG/50ML IV EMUL
INTRAVENOUS | Status: DC | PRN
  Administered 2022-03-04: 50 ug/kg/min via INTRAVENOUS

## 2022-03-04 MED ORDER — HYDROMORPHONE HCL 1 MG/ML IJ SOLN
0.2500 mg | INTRAMUSCULAR | Status: DC | PRN
  Administered 2022-03-04 (×4): 0.5 mg via INTRAVENOUS

## 2022-03-04 MED ORDER — FLUTICASONE PROPIONATE 50 MCG/ACT NA SUSP: 2.0000 | Freq: Every day | NASAL | Status: DC | PRN

## 2022-03-04 MED ORDER — SOD CITRATE-CITRIC ACID 500-334 MG/5ML PO SOLN
ORAL | Status: AC
  Filled 2022-03-04: qty 30

## 2022-03-04 MED ORDER — ACETAMINOPHEN 10 MG/ML IV SOLN: 1000.0000 mg | Freq: Once | INTRAVENOUS | Status: DC | PRN

## 2022-03-04 MED ORDER — SERTRALINE HCL 100 MG PO TABS
200.0000 mg | ORAL_TABLET | Freq: Every day | ORAL | Status: DC
  Administered 2022-03-05: 200 mg via ORAL
  Filled 2022-03-04: qty 2

## 2022-03-04 MED ORDER — PROPOFOL 10 MG/ML IV BOLUS
INTRAVENOUS | Status: DC | PRN
  Administered 2022-03-04: 200 mg via INTRAVENOUS

## 2022-03-04 MED ORDER — DOCUSATE SODIUM 100 MG PO CAPS
100.0000 mg | ORAL_CAPSULE | Freq: Two times a day (BID) | ORAL | Status: DC
  Administered 2022-03-04 – 2022-03-05 (×2): 100 mg via ORAL
  Filled 2022-03-04 (×2): qty 1

## 2022-03-04 MED ORDER — DEXAMETHASONE SODIUM PHOSPHATE 10 MG/ML IJ SOLN
INTRAMUSCULAR | Status: DC | PRN
  Administered 2022-03-04: 10 mg via INTRAVENOUS

## 2022-03-04 MED ORDER — KETOROLAC TROMETHAMINE 30 MG/ML IJ SOLN
30.0000 mg | Freq: Four times a day (QID) | INTRAMUSCULAR | Status: DC
  Administered 2022-03-04 – 2022-03-05 (×3): 30 mg via INTRAVENOUS
  Filled 2022-03-04 (×3): qty 1

## 2022-03-04 MED ORDER — OXYCODONE HCL 5 MG PO TABS
5.0000 mg | ORAL_TABLET | Freq: Once | ORAL | Status: AC | PRN
  Administered 2022-03-04: 5 mg via ORAL

## 2022-03-04 MED ORDER — OXYCODONE HCL 5 MG PO TABS
ORAL_TABLET | ORAL | Status: AC
  Filled 2022-03-04: qty 1

## 2022-03-04 MED ORDER — GABAPENTIN 300 MG PO CAPS
ORAL_CAPSULE | ORAL | Status: AC
  Administered 2022-03-04: 300 mg via ORAL
  Filled 2022-03-04: qty 1

## 2022-03-04 MED ORDER — CYCLOBENZAPRINE HCL 10 MG PO TABS
10.0000 mg | ORAL_TABLET | Freq: Three times a day (TID) | ORAL | Status: DC | PRN
Start: 2022-03-04 — End: 2022-03-05
  Administered 2022-03-05: 10 mg via ORAL
  Filled 2022-03-04: qty 1

## 2022-03-04 MED ORDER — ACETAMINOPHEN 500 MG PO TABS: 1000.0000 mg | ORAL_TABLET | ORAL | Status: AC

## 2022-03-04 MED ORDER — DEXAMETHASONE SODIUM PHOSPHATE 10 MG/ML IJ SOLN
INTRAMUSCULAR | Status: AC
  Filled 2022-03-04: qty 1

## 2022-03-04 MED ORDER — CEFAZOLIN SODIUM-DEXTROSE 2-4 GM/100ML-% IV SOLN
INTRAVENOUS | Status: AC
  Filled 2022-03-04: qty 100

## 2022-03-04 MED ORDER — LORAZEPAM 1 MG PO TABS
1.0000 mg | ORAL_TABLET | Freq: Four times a day (QID) | ORAL | Status: DC | PRN
Start: 2022-03-04 — End: 2022-03-05
  Administered 2022-03-04 – 2022-03-05 (×3): 1 mg via ORAL
  Filled 2022-03-04 (×3): qty 1

## 2022-03-04 MED ORDER — OXYCODONE HCL 5 MG PO TABS: 5.0000 mg | ORAL_TABLET | Freq: Four times a day (QID) | ORAL | 0 refills | Status: DC | PRN

## 2022-03-04 SURGICAL SUPPLY — 27 items
CANISTER SUCT 3000ML PPV (MISCELLANEOUS) ×1 IMPLANT
GAUZE 4X4 16PLY ~~LOC~~+RFID DBL (SPONGE) ×2 IMPLANT
GAUZE PACKING 2X5 YD STRL (GAUZE/BANDAGES/DRESSINGS) IMPLANT
GLOVE BIOGEL PI IND STRL 6.5 (GLOVE) ×1 IMPLANT
GLOVE BIOGEL PI INDICATOR 6.5 (GLOVE) ×1
GLOVE ECLIPSE 7.0 STRL STRAW (GLOVE) ×4 IMPLANT
GLOVE SURG SS PI 7.0 STRL IVOR (GLOVE) ×4 IMPLANT
GLOVE SURG UNDER POLY LF SZ7 (GLOVE) ×4 IMPLANT
GOWN STRL REUS W/ TWL LRG LVL3 (GOWN DISPOSABLE) ×5 IMPLANT
GOWN STRL REUS W/TWL LRG LVL3 (GOWN DISPOSABLE) ×8
KIT TURNOVER KIT B (KITS) ×2 IMPLANT
NDL SPNL 18GX3.5 QUINCKE PK (NEEDLE) ×1 IMPLANT
NEEDLE SPNL 18GX3.5 QUINCKE PK (NEEDLE) ×2 IMPLANT
NS IRRIG 1000ML POUR BTL (IV SOLUTION) ×2 IMPLANT
PACK VAGINAL WOMENS (CUSTOM PROCEDURE TRAY) ×2 IMPLANT
PAD OB MATERNITY 4.3X12.25 (PERSONAL CARE ITEMS) ×2 IMPLANT
SPECIMEN JAR MEDIUM (MISCELLANEOUS) IMPLANT
SPIKE FLUID TRANSFER (MISCELLANEOUS) ×1 IMPLANT
SUT VIC AB 0 CT1 18XCR BRD8 (SUTURE) ×3 IMPLANT
SUT VIC AB 0 CT1 27 (SUTURE) ×4
SUT VIC AB 0 CT1 27XBRD ANBCTR (SUTURE) ×2 IMPLANT
SUT VIC AB 0 CT1 8-18 (SUTURE) ×6
SUT VICRYL 0 TIES 12 18 (SUTURE) ×2 IMPLANT
SYR 20ML LL LF (SYRINGE) ×2 IMPLANT
TOWEL GREEN STERILE FF (TOWEL DISPOSABLE) ×3 IMPLANT
TRAY FOLEY W/BAG SLVR 14FR (SET/KITS/TRAYS/PACK) ×2 IMPLANT
UNDERPAD 30X36 HEAVY ABSORB (UNDERPADS AND DIAPERS) ×2 IMPLANT

## 2022-03-04 NOTE — Plan of Care (Signed)

## 2022-03-04 NOTE — Anesthesia Postprocedure Evaluation (Signed)
Anesthesia Post Note  Patient: Judy Welch  Procedure(s) Performed: HYSTERECTOMY VAGINAL WITH SALPINGECTOMY (Bilateral: Perineum)     Patient location during evaluation: PACU Anesthesia Type: General Level of consciousness: awake and alert Pain management: pain level controlled Vital Signs Assessment: post-procedure vital signs reviewed and stable Respiratory status: spontaneous breathing, nonlabored ventilation, respiratory function stable and patient connected to nasal cannula oxygen Cardiovascular status: blood pressure returned to baseline and stable Postop Assessment: no apparent nausea or vomiting Anesthetic complications: no   No notable events documented.  Last Vitals:  Vitals:   03/04/22 1248 03/04/22 1300  BP: 120/67 111/72  Pulse: 70 70  Resp:  13  Temp: 36.5 C   SpO2: 96% 96%    Last Pain:  Vitals:   03/04/22 1248  TempSrc:   PainSc: 8                  Amillion Scobee S

## 2022-03-04 NOTE — Op Note (Signed)
Preoperative diagnosis: abnormal uterine bleeding  Postoperative diagnosis: Same  Procedure: Transvaginal hysterectomy   Surgeon: Standley Dakins. Kennon Rounds, M.D.  Assistant: Hebert Soho, MD An experienced assistant was required given the standard of surgical care given the complexity of the case.  This assistant was needed for exposure, dissection, suctioning, retraction, instrument exchange,  and for overall help during the procedure.  Anesthesia: Birdie Hopes, MD, MD  Findings: normal appearing uterus and ovaries, tubes not seen.  Estimated blood loss: 125 cc  Specimen: Uterus to pathology  Reason for procedure: Patient had long h/o bleeding and pelvic pain.  Has been on Xarelto and this led to more bleeding not responsive to meds. H/o PE. The patient desired definitive treatment.  Risks of  hysterectomy reviewed.  Risks include but are not limited to bleeding, infection, injury to surrounding structures, including bowel, bladder and ureters, blood clots, and death.  Likelihood of success of surgery is high.   Procedure: Patient was taken to the OR where she was placed in dorsal lithotomy in [place]. She was prepped and draped in the usual sterile fashion. A timeout was performed. The patient received 2 g of Ancef prior to procedure. The patient had SCDs in place.  A speculum was placed inside the vagina. The cervix was visualized and grasped with a double-tooth tenaculum. 20 cc of 1% lidocaine with epinephrine were injected paracervically. A knife was used to make a circumferential incision around the vagina. An opened sponge was used to dissect the vagina off the cervix. The posterior peritoneum was entered sharply with Mayo scissors. The posterior peritoneum was tagged to the vaginal cuff with a single stitch. The anterior peritoneal cavity was pushed up and away from the cervix with careful dissection of the bladder off the underlying cervix. A Heaney clamp was used to clamp first  the left uterosacral ligament and cardinal which was then cut and Haney suture ligated with 0 Vicryl stitch, the stitch was held. Similarly the right uterosacral ligament was clamped cut and suture ligated. Sequential bites up the broad to the uterine arteries were taken until the tubo-ovarian pedicles were encountered. The uterus was then inverted and the left utero-ovarian pedicle grasped with a Heaney clamp x 2. A free tie and suture ligature used to achieve hemostasis and secure the utero-ovarian pedicles bilaterally.  Heaney clamp. Inspection of all pedicles revealed adequate hemostasis. The vagina was closed with 0 Vicryl suture in a locked running fashion with care taken to incorporate the uterosacral pedicles. Excellent hemostasis was noted at the end of the case. The vaginal cuff was inspected there was minimal bleeding noted.  A Foley catheter is placed inside her bladder. Clear, yellow urine was noted. All instrument needle and lap counts were correct x 2. Patient was awakened taken to recovery room in stable condition.  [name], MD 03/04/2022, 12:39 PM

## 2022-03-04 NOTE — H&P (Signed)
Judy Welch is an 46 y.o. [place] female.   Chief Complaint: abnormal uterine bleeding HPI: Patient is a [id number] who has a complicated medical hx including sepsis with tricuspid endocarditis (sinus infection) and now s/p valve repair and septic emboli. On Xarelto due to A. Fib and the PE burden. Seen and booked for [facility] in 7/22--had to cancel as she was admitted with pneumonia. Now ready for surgery. Still having pain and heavy bleeding. Cylce was 11-12 days on the [facility]. Having nipple tenderness related to this. Anemia due to heavy vaginal bleeding. Has declined other interventions. EMB WNL 4/22. U/S at that time showd Uterus approx. 8.3 x 3.6.4.7 cm  Past Medical History:  Diagnosis Date   Alcohol abuse    pt reports "quit dirnking 08/29/2016 with brief relapse in April 2023"- no longer drinks   Anemia    Anxiety    Atrial fibrillation (HCC)    Breast disorder    left breast lump   Depression    Endocarditis    Fibrillation, atrial (HCC)    Heart murmur    pt reports being told she had a heart murmur in the past   Insomnia disorder    Mental disorder    anxiety   Ovarian cyst    Pneumonia    July 2022   PTSD (post-traumatic stress disorder)    Pulmonary embolism ([place])    2020   Septic arthritis (HCC)    right hip   Septic embolism ([place])    2020   Septic shock (HCC)    SVT (supraventricular tachycardia) (HCC)    Tricuspid valve disorder    Vaginal Pap smear, abnormal     Past Surgical History:  Procedure Laterality Date   CARDIAC ELECTROPHYSIOLOGY MAPPING AND ABLATION     CARDIAC ELECTROPHYSIOLOGY STUDY AND ABLATION     CARPAL TUNNEL RELEASE Right    CHOLECYSTECTOMY     HIP SURGERY     THORACENTESIS Left    TOTAL HIP ARTHROPLASTY Right    TRICUSPID VALVE REPLACEMENT      Family History  Problem Relation Age of Onset   Diabetes Father    Hypertension Father    Cancer Mother        bladder   Cancer Maternal Aunt        breast   Stroke Neg Hx    Social History:   reports that she has never smoked. She has never used smokeless tobacco. She reports that she does not currently use alcohol. She reports that she does not use drugs.  Allergies:  Allergies  Allergen Reactions   Sulfa Antibiotics Other (See Comments)    Can't take with DOFETILIDE/ TIKOSYN    Sulfamethoxazole-Trimethoprim Other (See Comments)    Can't take with DOFETILIDE/ TIKOSYN    Medications Prior to Admission  Medication Sig Dispense Refill   buPROPion (WELLBUTRIN XL) 150 MG 24 hr tablet Take 150 mg by mouth See admin instructions. Tale with 300 mg for a total of 450 mg in the morning     buPROPion (WELLBUTRIN XL) 300 MG 24 hr tablet Take 300 mg by mouth See admin instructions. Take with 150 mg for a total of 450 mg in the morning     cyclobenzaprine (FLEXERIL) 10 MG tablet Take 10 mg by mouth 3 (three) times daily as needed for muscle spasms.     docusate sodium (COLACE) 100 MG capsule Take 1 capsule (100 mg total) by mouth 2 (two) times daily as needed. (Patient taking  differently: Take 100 mg by mouth 2 (two) times daily.) 30 capsule 2   dofetilide (TIKOSYN) 250 MCG capsule Take 250 mcg by mouth 2 (two) times daily.     Drospirenone (SLYND) 4 MG TABS Take 1 tablet by mouth daily. 28 tablet 6   ferrous sulfate 325 (65 FE) MG tablet Take 325 mg by mouth daily with breakfast.     hydrOXYzine (ATARAX) 50 MG tablet Take 25-50 mg by mouth daily as needed for anxiety or itching.     LORazepam (ATIVAN) 1 MG tablet Take 1 mg by mouth 4 (four) times daily as needed for anxiety (Panic attack).     omeprazole (PRILOSEC OTC) 20 MG tablet Take 2 tablets (40 mg total) by mouth daily. 30 tablet 3   pregabalin (LYRICA) 200 MG capsule Take 1 capsule (200 mg total) by mouth in the morning, at noon, and at bedtime.     Pyridoxine HCl (VITAMIN B-6 CR) 200 MG TBCR Take 200 mg by mouth daily.     rivaroxaban (XARELTO) 20 MG TABS tablet Take 1 tablet (20 mg total) by mouth daily with supper. 30 tablet     rosuvastatin (CRESTOR) 10 MG tablet Take 10 mg by mouth every morning.     sertraline (ZOLOFT) 100 MG tablet Take 2 tablets (200 mg total) by mouth daily.     zolpidem (AMBIEN) 10 MG tablet Take 10 mg by mouth at bedtime as needed for sleep.     fluticasone (FLONASE) 50 MCG/ACT nasal spray Place 2 sprays into both nostrils daily as needed for allergies.     furosemide (LASIX) 20 MG tablet Take 1 tablet (20 mg total) by mouth as needed. (Patient taking differently: Take 20 mg by mouth as needed for fluid or edema.) 30 tablet    potassium chloride (MICRO-K) 10 MEQ CR capsule Take 1 capsule (10 mEq total) by mouth as needed. (Patient not taking: Reported on 02/19/2022)      A comprehensive review of systems was negative.  Blood pressure 129/69, pulse 72, temperature 97.7 F (36.5 C), temperature source Oral, resp. rate 18, height '5\' 8"'$  (1.727 m), weight 113.4 kg, last menstrual period 02/23/2022, SpO2 99 %. BP 129/69   Pulse 72   Temp 97.7 F (36.5 C) (Oral)   Resp 18   Ht '5\' 8"'$  (1.727 m)   Wt 113.4 kg   LMP 02/23/2022   SpO2 99%   BMI 38.01 kg/m  General appearance: alert, cooperative, and appears stated age Head: Normocephalic, without obvious abnormality, atraumatic Neck: thyroid not enlarged, symmetric, no tenderness/mass/nodules Lungs:  normal effort Heart: regular rate and rhythm Abdomen: soft, non-tender; bowel sounds normal; no masses,  no organomegaly   Lab Results  Component Value Date   WBC 5.4 02/27/2022   HGB 11.8 (L) 02/27/2022   HCT 36.8 02/27/2022   MCV 84.8 02/27/2022   PLT 221 02/27/2022   Lab Results  Component Value Date   PREGTESTUR Negative 12/06/2020     Assessment/Plan Principal Problem:   Abnormal uterine bleeding (AUB)  For TVH with salpingectomy if able. Risks include but are not limited to bleeding, infection, injury to surrounding structures, including bowel, bladder and ureters, blood clots, and death.  Likelihood of success is  high.    Judy Welch 03/04/2022, 11:05 AM

## 2022-03-04 NOTE — Anesthesia Procedure Notes (Signed)
Procedure Name: Intubation Date/Time: 03/04/2022 11:43 AM  Performed by: Minerva Ends, CRNAPre-anesthesia Checklist: Patient identified, Emergency Drugs available, Suction available and Patient being monitored Patient Re-evaluated:Patient Re-evaluated prior to induction Oxygen Delivery Method: Circle system utilized Preoxygenation: Pre-oxygenation with 100% oxygen Induction Type: IV induction Ventilation: Mask ventilation without difficulty Laryngoscope Size: Mac and 3 Grade View: Grade I Tube type: Oral Number of attempts: 1 Airway Equipment and Method: Stylet Placement Confirmation: ETT inserted through vocal cords under direct vision, positive ETCO2 and breath sounds checked- equal and bilateral Secured at: 22 cm Tube secured with: Tape Dental Injury: Teeth and Oropharynx as per pre-operative assessment

## 2022-03-04 NOTE — Transfer of Care (Signed)
Immediate Anesthesia Transfer of Care Note  Patient: Judy Welch  Procedure(s) Performed: HYSTERECTOMY VAGINAL WITH SALPINGECTOMY (Bilateral: Perineum)  Patient Location: PACU  Anesthesia Type:General  Level of Consciousness: awake and alert   Airway & Oxygen Therapy: Patient Spontanous Breathing  Post-op Assessment: Report given to RN, Post -op Vital signs reviewed and stable and Patient moving all extremities  Post vital signs: Reviewed and stable  Last Vitals:  Vitals Value Taken Time  BP 120/67 03/04/22 1248  Temp    Pulse 65 03/04/22 1252  Resp 18 03/04/22 1252  SpO2 99 % 03/04/22 1252  Vitals shown include unvalidated device data.  Last Pain:  Vitals:   03/04/22 1040  TempSrc:   PainSc: 0-No pain         Complications: No notable events documented.

## 2022-03-05 ENCOUNTER — Encounter (HOSPITAL_COMMUNITY): Payer: Self-pay | Admitting: Family Medicine

## 2022-03-05 ENCOUNTER — Other Ambulatory Visit (HOSPITAL_COMMUNITY): Payer: Self-pay

## 2022-03-05 LAB — CBC
HCT: 35.5 % — ABNORMAL LOW (ref 36.0–46.0)
Hemoglobin: 11.3 g/dL — ABNORMAL LOW (ref 12.0–15.0)
MCH: 27.2 pg (ref 26.0–34.0)
MCHC: 31.8 g/dL (ref 30.0–36.0)
MCV: 85.5 fL (ref 80.0–100.0)
Platelets: 216 10*3/uL (ref 150–400)
RBC: 4.15 MIL/uL (ref 3.87–5.11)
RDW: 17 % — ABNORMAL HIGH (ref 11.5–15.5)
WBC: 8.9 10*3/uL (ref 4.0–10.5)
nRBC: 0 % (ref 0.0–0.2)

## 2022-03-05 LAB — SURGICAL PATHOLOGY

## 2022-03-05 MED ORDER — HYDROMORPHONE HCL 1 MG/ML IJ SOLN: 1.0000 mg | INTRAMUSCULAR | Status: DC | PRN

## 2022-03-05 MED ORDER — OXYCODONE HCL 5 MG PO TABS
5.0000 mg | ORAL_TABLET | ORAL | 0 refills | Status: DC | PRN
  Filled 2022-03-05: qty 30, 5d supply, fill #0

## 2022-03-05 MED ORDER — HYDROMORPHONE HCL 1 MG/ML IJ SOLN
INTRAMUSCULAR | Status: AC
  Administered 2022-03-05: 1 mg via INTRAVENOUS
  Filled 2022-03-05: qty 1

## 2022-03-05 MED ORDER — COVID-19MRNA BIVAL VACC PFIZER 30 MCG/0.3ML IM SUSP
0.3000 mL | Freq: Once | INTRAMUSCULAR | Status: AC
  Administered 2022-03-05: 0.3 mL via INTRAMUSCULAR
  Filled 2022-03-05: qty 0.3

## 2022-03-05 NOTE — Progress Notes (Signed)
Discharge instrcutions given to patient. 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 Patient would like to stay on floor until next prn oxycodone is due '@1055'$ . It was last given at 6:55a

## 2022-03-05 NOTE — Progress Notes (Signed)
Patient discharged to home via wheelchair by SWAT nurse Honor Loh with all belongings.

## 2022-03-05 NOTE — Discharge Summary (Signed)
Physician Discharge Summary  Patient ID: MYKAL KIRCHMAN MRN: [id number] DOB/AGE: 47/13/1977 47 y.o.  Admit date: 03/04/2022 Discharge date: No discharge date for patient encounter.    Admission Diagnoses:  Principal Problem:   Abnormal uterine bleeding (AUB) Active Problems:   Status post hysterectomy   Discharge Diagnoses:  Same  Past Medical History:  Diagnosis Date   Alcohol abuse    pt reports "quit dirnking 08/29/2016 with brief relapse in April 2023"- no longer drinks   Anemia    Anxiety    Atrial fibrillation (HCC)    Breast disorder    left breast lump   Depression    Endocarditis    Fibrillation, atrial (HCC)    Heart murmur    pt reports being told she had a heart murmur in the past   Insomnia disorder    Mental disorder    anxiety   Ovarian cyst    Pneumonia    July 2022   PTSD (post-traumatic stress disorder)    Pulmonary embolism ([place])    2020   Septic arthritis ([place])    right hip   Septic embolism ([place])    2020   Septic shock (HCC)    SVT (supraventricular tachycardia) (HCC)    Tricuspid valve disorder    Vaginal Pap smear, abnormal     Surgeries: Procedure(s): HYSTERECTOMY VAGINAL on 03/04/2022   Consultants: None  Discharged Condition: Improved  Hospital Course: MELANNI BENWAY is an 46 y.o. female [place] who was admitted 03/04/2022 with a chief complaint of abnormal uterine bleeding, and found to have a diagnosis of Abnormal uterine bleeding (AUB).  They were brought to the operating room on 03/04/2022 and underwent the above named procedures.    She was given perioperative antibiotics:  Anti-infectives (From admission, onward)    Start     Dose/Rate Route Frequency Ordered Stop   03/04/22 1100  ceFAZolin (ANCEF) IVPB 2g/100 mL premix        2 g 200 mL/hr over 30 Minutes Intravenous On call to O.R. 03/04/22 1045 03/04/22 1200   03/04/22 1054  ceFAZolin (ANCEF) 2-4 GM/100ML-% IVPB       Note to Pharmacy: Alba Cory B: cabinet override       03/04/22 1054 03/04/22 1153     .   She was given sequential compression devices, early ambulation, and chemoprophylaxis for DVT prophylaxis.  She benefited maximally from their hospital stay and there were no complications. She was ambulating, voiding, tolerating po and deemed stable for discharge.  Recent vital signs:  Vitals:   03/05/22 0619 03/05/22 0751  BP:    Pulse:  (P) 68  Resp: 18   Temp:  (P) 98 F (36.7 C)  SpO2:     Discharge exam: Physical Examination: General appearance - alert, well appearing, and in no distress Chest - normal effort Heart - normal rate and regular rhythm Abdomen - soft, appropriately tender, dressing is clean and dry Extremities - peripheral pulses normal, no pedal edema, no clubbing or cyanosis, Homan's sign negative bilaterally  Recent laboratory studies:  Results for orders placed or performed during the hospital encounter of 03/04/22  CBC  Result Value Ref Range   WBC 8.6 4.0 - 10.5 K/uL   RBC 4.51 3.87 - 5.11 MIL/uL   Hemoglobin 12.3 12.0 - 15.0 g/dL   HCT 37.3 36.0 - 46.0 %   MCV 82.7 80.0 - 100.0 fL   MCH 27.3 26.0 - 34.0 pg   MCHC 33.0 30.0 -  36.0 g/dL   RDW 16.8 (H) 11.5 - 15.5 %   Platelets 197 150 - 400 K/uL   nRBC 0.0 0.0 - 0.2 %  Creatinine, serum  Result Value Ref Range   Creatinine, Ser 0.97 0.44 - 1.00 mg/dL   GFR, Estimated >60 >60 mL/min  CBC  Result Value Ref Range   WBC 8.9 4.0 - 10.5 K/uL   RBC 4.15 3.87 - 5.11 MIL/uL   Hemoglobin 11.3 (L) 12.0 - 15.0 g/dL   HCT 35.5 (L) 36.0 - 46.0 %   MCV 85.5 80.0 - 100.0 fL   MCH 27.2 26.0 - 34.0 pg   MCHC 31.8 30.0 - 36.0 g/dL   RDW 17.0 (H) 11.5 - 15.5 %   Platelets 216 150 - 400 K/uL   nRBC 0.0 0.0 - 0.2 %  Pregnancy, urine POC  Result Value Ref Range   Preg Test, Ur NEGATIVE NEGATIVE  ABO/Rh  Result Value Ref Range   ABO/RH(D)      A POS Performed at [facility], [address], [place], [place] [address]     Discharge Medications:   Allergies  as of 03/05/2022       Reactions   Sulfa Antibiotics Other (See Comments)   Can't take with DOFETILIDE/ TIKOSYN   Sulfamethoxazole-trimethoprim Other (See Comments)   Can't take with DOFETILIDE/ TIKOSYN        Medication List     TAKE these medications    buPROPion 150 MG 24 hr tablet Commonly known as: WELLBUTRIN XL Take 150 mg by mouth See admin instructions. Tale with 300 mg for a total of 450 mg in the morning   buPROPion 300 MG 24 hr tablet Commonly known as: WELLBUTRIN XL Take 300 mg by mouth See admin instructions. Take with 150 mg for a total of 450 mg in the morning   cyclobenzaprine 10 MG tablet Commonly known as: FLEXERIL Take 10 mg by mouth 3 (three) times daily as needed for muscle spasms.   docusate sodium 100 MG capsule Commonly known as: COLACE Take 1 capsule (100 mg total) by mouth 2 (two) times daily as needed. What changed: when to take this   dofetilide 250 MCG capsule Commonly known as: TIKOSYN Take 250 mcg by mouth 2 (two) times daily.   ferrous sulfate 325 (65 FE) MG tablet Take 325 mg by mouth daily with breakfast.   fluticasone 50 MCG/ACT nasal spray Commonly known as: FLONASE Place 2 sprays into both nostrils daily as needed for allergies.   furosemide 20 MG tablet Commonly known as: Lasix Take 1 tablet (20 mg total) by mouth as needed. What changed: reasons to take this   hydrOXYzine 50 MG tablet Commonly known as: ATARAX Take 25-50 mg by mouth daily as needed for anxiety or itching.   LORazepam 1 MG tablet Commonly known as: ATIVAN Take 1 mg by mouth 4 (four) times daily as needed for anxiety (Panic attack).   omeprazole 20 MG tablet Commonly known as: PriLOSEC OTC Take 2 tablets (40 mg total) by mouth daily.   oxyCODONE 5 MG immediate release tablet Commonly known as: Oxy IR/ROXICODONE Take 1 tablet (5 mg total) by mouth every 6 (six) hours as needed for severe pain.   potassium chloride 10 MEQ CR capsule Commonly known as:  MICRO-K Take 1 capsule (10 mEq total) by mouth as needed.   pregabalin 200 MG capsule Commonly known as: Lyrica Take 1 capsule (200 mg total) by mouth in the morning, at  noon, and at bedtime.   rivaroxaban 20 MG Tabs tablet Commonly known as: XARELTO Take 1 tablet (20 mg total) by mouth daily with supper.   rosuvastatin 10 MG tablet Commonly known as: CRESTOR Take 10 mg by mouth every morning.   sertraline 100 MG tablet Commonly known as: ZOLOFT Take 2 tablets (200 mg total) by mouth daily.   Vitamin B-6 CR 200 MG Tbcr Take 200 mg by mouth daily.   zolpidem 10 MG tablet Commonly known as: AMBIEN Take 10 mg by mouth at bedtime as needed for sleep.        Diagnostic Studies: No results found.  Disposition: Discharge disposition: 01-Home or Self Care       Discharge Instructions      Remove dressing in 72 hours   Complete by: As directed    Call MD for:  persistant nausea and vomiting   Complete by: As directed    Call MD for:  redness, tenderness, or signs of infection (pain, swelling, redness, odor or green/yellow discharge around incision site)   Complete by: As directed    Call MD for:  severe uncontrolled pain   Complete by: As directed    Call MD for:  temperature >100.4   Complete by: As directed    Diet - low sodium heart healthy   Complete by: As directed    Discharge patient   Complete by: As directed    Discharge disposition: 01-Home or Self Care   Discharge patient date: 03/05/2022   Driving Restrictions   Complete by: As directed    None while taking narcotic pain meds   Increase activity slowly   Complete by: As directed    Lifting restrictions   Complete by: As directed    Nothing > 20 lbs x 6 weeks   Sexual Activity Restrictions   Complete by: As directed    None x 6 weeks        Follow-up [facility].   Specialty: Obstetrics and Gynecology Contact information: [place], [place] [phone]                 Signed: [name] 03/05/2022, 8:22 AM

## 2022-03-05 NOTE — Plan of Care (Addendum)
Pt A&Ox4, independent, voided, bowel sounds (+), interested in covid & flu vaccinations if possible  Dry non productive cough post brownie & ice cream consumption  Foley removed for POD 1, pt voiding, ambulating independently in room & sitting in chair    Problem: Education: Goal: Knowledge of General Education information will improve Description: Including pain rating scale, medication(s)/side effects and non-pharmacologic comfort measures Outcome: Progressing   Problem: Health Behavior/Discharge Planning: Goal: Ability to manage health-related needs will improve Outcome: Progressing   Problem: Clinical Measurements: Goal: Ability to maintain clinical measurements within normal limits will improve Outcome: Progressing Goal: Will remain free from infection Outcome: Progressing Goal: Diagnostic test results will improve Outcome: Progressing Goal: Respiratory complications will improve Outcome: Progressing Goal: Cardiovascular complication will be avoided Outcome: Progressing   Problem: Activity: Goal: Risk for activity intolerance will decrease Outcome: Progressing   Problem: Nutrition: Goal: Adequate nutrition will be maintained Outcome: Progressing   Problem: Coping: Goal: Level of anxiety will decrease Outcome: Progressing   Problem: Elimination: Goal: Will not experience complications related to bowel motility Outcome: Progressing Goal: Will not experience complications related to urinary retention Outcome: Progressing   Problem: Pain Managment: Goal: General experience of comfort will improve Outcome: Progressing   Problem: Safety: Goal: Ability to remain free from injury will improve Outcome: Progressing   Problem: Skin Integrity: Goal: Risk for impaired skin integrity will decrease Outcome: Progressing

## 2022-03-05 NOTE — Progress Notes (Signed)
On call GYN contacted via phone re: pain regiment - no new orders - continue orders by Kennon Rounds MD.

## 2022-03-06 ENCOUNTER — Encounter: Payer: Self-pay | Admitting: Family Medicine

## 2022-03-18 ENCOUNTER — Ambulatory Visit (INDEPENDENT_AMBULATORY_CARE_PROVIDER_SITE_OTHER): Payer: Self-pay | Admitting: Obstetrics and Gynecology

## 2022-03-18 ENCOUNTER — Encounter: Payer: Self-pay | Admitting: Obstetrics and Gynecology

## 2022-03-18 VITALS — BP 141/93 | HR 89 | Ht 68.0 in | Wt 243.0 lb

## 2022-03-18 DIAGNOSIS — R32 Unspecified urinary incontinence: Secondary | ICD-10-CM

## 2022-03-18 DIAGNOSIS — N8110 Cystocele, unspecified: Secondary | ICD-10-CM

## 2022-03-18 NOTE — Progress Notes (Signed)
GYNECOLOGY NOTE  History:     Judy Welch is a 46 y.o. G0P0000 female here with concern about urine leaking and possible bladder prolapse. She is status post transvaginal hysterectomy by Dr. Kennon Rounds on 03/04/2022. Since surgery she has noticed leaking urine everyday, constantly. This is new since the surgery. She reports feeling wet throughout the day. She has no urinary complaints; no dysuria or frequency/ urgency. She reports feeling and seeing a bulging in the lower part of her vagina. She is concerned she has prolapsed her bladder. She has no pain associated with the bulging. She denies any vaginal bleeding or discharge. No vaginal odor, no fever.   Obstetric History OB History  Gravida Para Term Preterm AB Living  0 0 0 0 0 0  SAB IAB Ectopic Multiple Live Births  0 0 0 0 0    Past Medical History:  Diagnosis Date   Alcohol abuse    pt reports "quit dirnking 08/29/2016 with brief relapse in April 2023"- no longer drinks   Anemia    Anxiety    Atrial fibrillation (HCC)    Breast disorder    left breast lump   Depression    Endocarditis    Fibrillation, atrial (HCC)    Heart murmur    pt reports being told she had a heart murmur in the past   Insomnia disorder    Mental disorder    anxiety   Ovarian cyst    Pneumonia    July 2022   PTSD (post-traumatic stress disorder)    Pulmonary embolism ([place])    2020   Septic arthritis (HCC)    right hip   Septic embolism ([place])    2020   Septic shock (HCC)    SVT (supraventricular tachycardia) (HCC)    Tricuspid valve disorder    Vaginal Pap smear, abnormal     Past Surgical History:  Procedure Laterality Date   CARDIAC ELECTROPHYSIOLOGY MAPPING AND ABLATION     CARDIAC ELECTROPHYSIOLOGY STUDY AND ABLATION     CARPAL TUNNEL RELEASE Right    CHOLECYSTECTOMY     HIP SURGERY     THORACENTESIS Left    TOTAL HIP ARTHROPLASTY Right    TRICUSPID VALVE REPLACEMENT     VAGINAL HYSTERECTOMY Bilateral 03/04/2022   Procedure:  HYSTERECTOMY VAGINAL WITH SALPINGECTOMY;  Surgeon: [name], MD;  Location: [facility];  Service: Gynecology;  Laterality: Bilateral;    Current Outpatient Medications on File Prior to Visit  Medication Sig Dispense Refill   buPROPion (WELLBUTRIN XL) 150 MG 24 hr tablet Take 150 mg by mouth See admin instructions. Tale with 300 mg for a total of 450 mg in the morning     buPROPion (WELLBUTRIN XL) 300 MG 24 hr tablet Take 300 mg by mouth See admin instructions. Take with 150 mg for a total of 450 mg in the morning     cyclobenzaprine (FLEXERIL) 10 MG tablet Take 10 mg by mouth 3 (three) times daily as needed for muscle spasms.     docusate sodium (COLACE) 100 MG capsule Take 1 capsule (100 mg total) by mouth 2 (two) times daily as needed. (Patient taking differently: Take 100 mg by mouth 2 (two) times daily.) 30 capsule 2   dofetilide (TIKOSYN) 250 MCG capsule Take 250 mcg by mouth 2 (two) times daily.     ferrous sulfate 325 (65 FE) MG tablet Take 325 mg by mouth daily with breakfast.     fluticasone (FLONASE) 50  MCG/ACT nasal spray Place 2 sprays into both nostrils daily as needed for allergies.     hydrOXYzine (ATARAX) 50 MG tablet Take 25-50 mg by mouth daily as needed for anxiety or itching.     LORazepam (ATIVAN) 1 MG tablet Take 1 mg by mouth 4 (four) times daily as needed for anxiety (Panic attack).     omeprazole (PRILOSEC OTC) 20 MG tablet Take 2 tablets (40 mg total) by mouth daily. 30 tablet 3   pregabalin (LYRICA) 200 MG capsule Take 1 capsule (200 mg total) by mouth in the morning, at noon, and at bedtime.     Pyridoxine HCl (VITAMIN B-6 CR) 200 MG TBCR Take 200 mg by mouth daily.     rivaroxaban (XARELTO) 20 MG TABS tablet Take 1 tablet (20 mg total) by mouth daily with supper. 30 tablet    rosuvastatin (CRESTOR) 10 MG tablet Take 10 mg by mouth every morning.     sertraline (ZOLOFT) 100 MG tablet Take 2 tablets (200 mg total) by mouth daily.     zolpidem (AMBIEN) 10 MG tablet Take  10 mg by mouth at bedtime as needed for sleep.     furosemide (LASIX) 20 MG tablet Take 1 tablet (20 mg total) by mouth as needed. (Patient not taking: Reported on 03/18/2022) 30 tablet    potassium chloride (MICRO-K) 10 MEQ CR capsule Take 1 capsule (10 mEq total) by mouth as needed. (Patient not taking: Reported on 02/19/2022)     No current facility-administered medications on file prior to visit.    Allergies  Allergen Reactions   Sulfa Antibiotics Other (See Comments)    Can't take with DOFETILIDE/ TIKOSYN    Sulfamethoxazole-Trimethoprim Other (See Comments)    Can't take with DOFETILIDE/ TIKOSYN    Social History:  reports that she has never smoked. She has never used smokeless tobacco. She reports that she does not currently use alcohol. She reports that she does not use drugs.  Family History  Problem Relation Age of Onset   Diabetes Father    Hypertension Father    Cancer Mother        bladder   Cancer Maternal Aunt        breast   Stroke Neg Hx     The following portions of the patient's history were reviewed and updated as appropriate: allergies, current medications, past family history, past medical history, past social history, past surgical history and problem list.  Review of Systems Pertinent items noted in HPI and remainder of comprehensive ROS otherwise negative.  Physical Exam:  BP (!) 141/93   Pulse 89   Ht '5\' 8"'$  (1.727 m)   Wt 243 lb (110.2 kg)   LMP 02/23/2022   BMI 36.95 kg/m  CONSTITUTIONAL: Well-developed, well-nourished female in no acute distress.  HENT:  Normocephalic, atraumatic NECK: Normal range of motion MUSCULOSKELETAL: Normal range of motion.  ABDOMEN: Soft, no distention noted.  No tenderness, rebound or guarding.  PELVIC: Speculum inserted into the vagina. Difficult exam due to vaginal wall collapse. Vaginal cuff palpated without pain or discomfort. Grade 1 cystocele noted. Chaperone present.    Assessment and Plan:   1. Urinary  incontinence, unspecified type  - Ambulatory referral to Urogynecology - Urine Culture  2. Female bladder prolapse  - Likely cystocele  - Keep f/u surgical visit with Dr. Kennon Rounds this week.  - Ambulatory referral to Urogynecology     Johnasia Liese, Artist [place], [place] for [employer], [facility]

## 2022-03-20 ENCOUNTER — Encounter: Payer: Self-pay | Admitting: Family Medicine

## 2022-03-20 ENCOUNTER — Telehealth (INDEPENDENT_AMBULATORY_CARE_PROVIDER_SITE_OTHER): Payer: Self-pay | Admitting: Family Medicine

## 2022-03-20 DIAGNOSIS — Z09 Encounter for follow-up examination after completed treatment for conditions other than malignant neoplasm: Secondary | ICD-10-CM

## 2022-03-20 DIAGNOSIS — R32 Unspecified urinary incontinence: Secondary | ICD-10-CM

## 2022-03-20 LAB — URINE CULTURE
MICRO NUMBER:: 13695541
Result:: NO GROWTH
SPECIMEN QUALITY:: ADEQUATE

## 2022-03-20 NOTE — Progress Notes (Signed)
GYNECOLOGY VIRTUAL VISIT ENCOUNTER NOTE  Provider location: [facility]   Patient location: Home  I connected with Judy Welch on 03/20/22 at  1:10 PM EDT by MyChart Video Encounter and verified that I am speaking with the correct person using two identifiers.   I discussed the limitations, risks, security and privacy concerns of performing an evaluation and management service virtually and the availability of in person appointments. I also discussed with the patient that there may be a patient responsible charge related to this service. The patient expressed understanding and agreed to proceed.   History:  Judy Welch is a 46 y.o. G0P0000 female being evaluated today for postop check following TVH on 03/04/2022. She is doing well postoperatively except for leaking urine. Came in to see Noni Saupe NP with some leaking of fluid. Had a negative urine culture. Notes she leaks with sneezing, coughing and standing. Feels moist all the time.  Reports having a bulge that comes down following surgery that appears to be her bladder. She denies any abnormal vaginal discharge, bleeding, pelvic pain or other concerns.       Past Medical History:  Diagnosis Date   Alcohol abuse    pt reports "quit dirnking 08/29/2016 with brief relapse in April 2023"- no longer drinks   Anemia    Anxiety    Atrial fibrillation (HCC)    Breast disorder    left breast lump   Depression    Endocarditis    Fibrillation, atrial (HCC)    Heart murmur    pt reports being told she had a heart murmur in the past   Insomnia disorder    Mental disorder    anxiety   Ovarian cyst    Pneumonia    July 2022   PTSD (post-traumatic stress disorder)    Pulmonary embolism ([place])    2020   Septic arthritis (HCC)    right hip   Septic embolism ([place])    2020   Septic shock (HCC)    SVT (supraventricular tachycardia) (HCC)    Tricuspid valve disorder    Vaginal Pap smear,  abnormal    Past Surgical History:  Procedure Laterality Date   CARDIAC ELECTROPHYSIOLOGY MAPPING AND ABLATION     CARDIAC ELECTROPHYSIOLOGY STUDY AND ABLATION     CARPAL TUNNEL RELEASE Right    CHOLECYSTECTOMY     HIP SURGERY     THORACENTESIS Left    TOTAL HIP ARTHROPLASTY Right    TRICUSPID VALVE REPLACEMENT     VAGINAL HYSTERECTOMY Bilateral 03/04/2022   Procedure: HYSTERECTOMY VAGINAL WITH SALPINGECTOMY;  Surgeon: [name], MD;  Location: [facility];  Service: Gynecology;  Laterality: Bilateral;   The following portions of the patient's history were reviewed and updated as appropriate: allergies, current medications, past family history, past medical history, past social history, past surgical history and problem list.   Health Maintenance:  Normal pap and negative HRHPV on 11/14/2020.  Normal mammogram on 02/14/22.   Review of Systems:  Pertinent items noted in HPI and remainder of comprehensive ROS otherwise negative.  Physical Exam:   General:  Alert, oriented and cooperative. Patient appears to be in no acute distress.  Mental Status: Normal mood and affect. Normal behavior. Normal judgment and thought content.   Respiratory: Normal respiratory effort, no problems with respiration noted  Rest of physical exam deferred due to type of encounter  Labs and Imaging Results for orders placed or performed in  visit on 03/18/22 (from the past 336 hour(s))  Urine Culture   Collection Time: 03/18/22 11:32 AM   Specimen: Urine  Result Value Ref Range   MICRO NUMBER: [id number]    SPECIMEN QUALITY: Adequate    Sample Source NOT GIVEN    STATUS: FINAL    Result: No Growth    No results found.     Assessment and Plan:     Urinary incontinence, unspecified type - Has urogyn referral for cystocele, noted postoperatively--check VCUG to ensure no injury or fistula. - Plan: DG Cystogram Voiding  Postop check - f/u in person for exam in a few weeks       I discussed the  assessment and treatment plan with the patient. The patient was provided an opportunity to ask questions and all were answered. The patient agreed with the plan and demonstrated an understanding of the instructions.   The patient was advised to call back or seek an in-person evaluation/go to the ED if the symptoms worsen or if the condition fails to improve as anticipated.  I provided 16 minutes of face-to-face time during this encounter.   [name], MD [facility] [employer], [place]

## 2022-03-25 ENCOUNTER — Encounter: Payer: Self-pay | Admitting: Family Medicine

## 2022-03-25 ENCOUNTER — Other Ambulatory Visit: Payer: Self-pay | Admitting: *Deleted

## 2022-03-25 DIAGNOSIS — R32 Unspecified urinary incontinence: Secondary | ICD-10-CM

## 2022-03-25 NOTE — Progress Notes (Signed)
error 

## 2022-04-02 ENCOUNTER — Other Ambulatory Visit (HOSPITAL_COMMUNITY): Payer: Medicaid Other

## 2022-04-07 ENCOUNTER — Other Ambulatory Visit (HOSPITAL_COMMUNITY)
Admission: RE | Admit: 2022-04-07 | Discharge: 2022-04-07 | Disposition: A | Discharge status: Home / Self Care | Payer: Medicaid Other | Source: Ambulatory Visit | Attending: Obstetrics and Gynecology | Admitting: Obstetrics and Gynecology

## 2022-04-07 ENCOUNTER — Ambulatory Visit
Admission: EM | Admit: 2022-04-07 | Discharge: 2022-04-07 | Disposition: A | Discharge status: Home / Self Care | Payer: Medicaid Other | Attending: Family Medicine | Admitting: Family Medicine

## 2022-04-07 ENCOUNTER — Encounter: Payer: Self-pay | Admitting: Emergency Medicine

## 2022-04-07 ENCOUNTER — Ambulatory Visit (INDEPENDENT_AMBULATORY_CARE_PROVIDER_SITE_OTHER): Payer: Self-pay

## 2022-04-07 DIAGNOSIS — K121 Other forms of stomatitis: Secondary | ICD-10-CM

## 2022-04-07 DIAGNOSIS — Z113 Encounter for screening for infections with a predominantly sexual mode of transmission: Secondary | ICD-10-CM

## 2022-04-07 DIAGNOSIS — N898 Other specified noninflammatory disorders of vagina: Secondary | ICD-10-CM | POA: Insufficient documentation

## 2022-04-07 MED ORDER — CLINDAMYCIN HCL 300 MG PO CAPS: ORAL_CAPSULE | ORAL | 0 refills | Status: DC

## 2022-04-07 NOTE — ED Triage Notes (Signed)
Patient states that she bit into a sausage biscuit on Saturday and the left side of her tongue has become very painful.  Now the pain is under her tongue, radiates down her throat, difficulty brushing her teeth.  Patient has tried Orajel w/minimal relief.

## 2022-04-07 NOTE — Discharge Instructions (Addendum)
Try warm salt water gargles for sore throat. May take Tylenol as needed for pain. Recommend taking a probiotic (such as yogurt with live culture) while taking clindamycin.

## 2022-04-07 NOTE — Progress Notes (Signed)
Pt here for self swab. Pt having symptoms of BV. Pt request STI testing.

## 2022-04-07 NOTE — ED Provider Notes (Signed)
[name]    CSN: [id number] Arrival date & time: 04/07/22  1450      History   Chief Complaint Chief Complaint  Patient presents with   Oral Swelling    HPI Judy Welch is a 46 y.o. female.   Patient reports that she bit into a sausage biscuit two days ago resulting in pain in the left inferior aspect of her tongue.  She has not seen a distinct abrasion/laceration, but the area has become increasingly painful without swelling.  The pain radiates down her throat and left lateral neck.  She has discomfort when brushing her teeth.  She feels well however, without fatigue, fevers, chills, and sweats, or myalgias.  The history is provided by the patient.    Past Medical History:  Diagnosis Date   Alcohol abuse    pt reports "quit dirnking 08/29/2016 with brief relapse in April 2023"- no longer drinks   Anemia    Anxiety    Atrial fibrillation (HCC)    Breast disorder    left breast lump   Depression    Endocarditis    Fibrillation, atrial (HCC)    Heart murmur    pt reports being told she had a heart murmur in the past   Insomnia disorder    Mental disorder    anxiety   Ovarian cyst    Pneumonia    July 2022   PTSD (post-traumatic stress disorder)    Pulmonary embolism ([place])    2020   Septic arthritis ([place])    right hip   Septic embolism ([place])    2020   Septic shock ([place])    SVT (supraventricular tachycardia) ([place])    Tricuspid valve disorder    Vaginal Pap smear, abnormal     Patient Active Problem List   Diagnosis Date Noted   Status post hysterectomy 03/04/2022   Anticoagulant long-term use 12/20/2020   Abnormal uterine bleeding (AUB) 11/14/2020   Generalized anxiety disorder with panic attacks 11/13/2020   MDD (major depressive disorder), recurrent episode, moderate ([place]) 11/13/2020   Primary insomnia 11/13/2020   Arthritis of right hip 05/03/2020   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    History of alcohol use disorder 07/02/2019   Hypokalemia 06/08/2019   Microcytic anemia 06/08/2019   Paroxysmal atrial fibrillation ([place]) 06/07/2019   Pulmonary embolus ([place]) 06/07/2019   Chronic pain syndrome 03/08/2018   SI (sacroiliac) pain 08/19/2017   Neuropathy, alcoholic ([place]) 70/26/3785   Anemia 09/26/2013    Past Surgical History:  Procedure Laterality Date   CARDIAC ELECTROPHYSIOLOGY MAPPING AND ABLATION     CARDIAC ELECTROPHYSIOLOGY STUDY AND ABLATION     CARPAL TUNNEL RELEASE Right    CHOLECYSTECTOMY     HIP SURGERY     THORACENTESIS Left    TOTAL HIP ARTHROPLASTY Right    TRICUSPID VALVE REPLACEMENT     VAGINAL HYSTERECTOMY Bilateral 03/04/2022   Procedure: HYSTERECTOMY VAGINAL WITH SALPINGECTOMY;  Surgeon: [name], MD;  Location: [facility];  Service: Gynecology;  Laterality: Bilateral;    OB History     Gravida  0   Para  0   Term  0   Preterm  0   AB  0   Living  0      SAB  0   IAB  0   Ectopic  0   Multiple  0   Live Births  0  Home Medications    Prior to Admission medications   Medication Sig Start Date End Date Taking? Authorizing Provider  buPROPion (WELLBUTRIN XL) 150 MG 24 hr tablet Take 150 mg by mouth See admin instructions. Tale with 300 mg for a total of 450 mg in the morning   Yes [provider]  buPROPion (WELLBUTRIN XL) 300 MG 24 hr tablet Take 300 mg by mouth See admin instructions. Take with 150 mg for a total of 450 mg in the morning 01/13/22  Yes [provider]  clindamycin (CLEOCIN) 300 MG capsule Take one cap PO every 8 hours 04/07/22  Yes Keyanna Sandefer, Ishmael Holter, MD  cyclobenzaprine (FLEXERIL) 10 MG tablet Take 10 mg by mouth 3 (three) times daily as needed for muscle spasms. 01/21/16  Yes [provider]  docusate sodium (COLACE) 100 MG capsule Take 1 capsule (100 mg total) by mouth 2 (two) times daily as needed. Patient taking differently: Take 100 mg by mouth 2 (two) times  daily. 02/04/21  Yes [name], MD  dofetilide (TIKOSYN) 250 MCG capsule Take 250 mcg by mouth 2 (two) times daily. 07/12/20  Yes [provider]  ferrous sulfate 325 (65 FE) MG tablet Take 325 mg by mouth daily with breakfast.   Yes [provider]  fluticasone (FLONASE) 50 MCG/ACT nasal spray Place 2 sprays into both nostrils daily as needed for allergies.   Yes [provider]  furosemide (LASIX) 20 MG tablet Take 1 tablet (20 mg total) by mouth as needed. 02/04/21  Yes [name], MD  hydrOXYzine (ATARAX) 50 MG tablet Take 25-50 mg by mouth daily as needed for anxiety or itching. 01/13/22  Yes [provider]  LORazepam (ATIVAN) 1 MG tablet Take 1 mg by mouth 4 (four) times daily as needed for anxiety (Panic attack). 12/12/20  Yes [provider]  omeprazole (PRILOSEC OTC) 20 MG tablet Take 2 tablets (40 mg total) by mouth daily. 02/04/21  Yes [name], MD  potassium chloride (MICRO-K) 10 MEQ CR capsule Take 1 capsule (10 mEq total) by mouth as needed. 02/04/21  Yes [name], MD  pregabalin (LYRICA) 200 MG capsule Take 1 capsule (200 mg total) by mouth in the morning, at noon, and at bedtime. 02/04/21  Yes [name], MD  Pyridoxine HCl (VITAMIN B-6 CR) 200 MG TBCR Take 200 mg by mouth daily.   Yes [provider]  rivaroxaban (XARELTO) 20 MG TABS tablet Take 1 tablet (20 mg total) by mouth daily with supper. 02/04/21  Yes [name], MD  rosuvastatin (CRESTOR) 10 MG tablet Take 10 mg by mouth every morning.   Yes [provider]  sertraline (ZOLOFT) 100 MG tablet Take 2 tablets (200 mg total) by mouth daily. 02/04/21  Yes [name], MD  zolpidem (AMBIEN) 10 MG tablet Take 10 mg by mouth at bedtime as needed for sleep. 12/12/20  Yes [provider]    Family History Family History  Problem Relation Age of Onset   Diabetes Father    Hypertension Father    Cancer Mother        bladder   Cancer  Maternal Aunt        breast   Stroke Neg Hx     Social History Social History   Tobacco Use   Smoking status: Never   Smokeless tobacco: Never  Vaping Use   Vaping Use: Never used  Substance Use Topics   Alcohol use:  Not Currently    Comment: pt reports alcohol abuse in the past (quit 08/29/2016)- last relapse April 2023, currently not drinking   Drug use: No     Allergies   Sulfa antibiotics and Sulfamethoxazole-trimethoprim   Review of Systems Review of Systems  Constitutional:  Negative for appetite change, chills, diaphoresis, fatigue and fever.  HENT:  Positive for mouth sores. Negative for congestion, dental problem, drooling, facial swelling, rhinorrhea, sinus pressure, sinus pain, sore throat and trouble swallowing.   Eyes: Negative.   Respiratory: Negative.    Cardiovascular: Negative.   Gastrointestinal: Negative.   Genitourinary: Negative.   Musculoskeletal: Negative.   Skin: Negative.   Neurological:  Negative for headaches.  Hematological:  Positive for adenopathy.     Physical Exam Triage Vital Signs ED Triage Vitals  Enc Vitals Group     BP 04/07/22 1501 122/87     Pulse Rate 04/07/22 1501 82     Resp 04/07/22 1501 18     Temp 04/07/22 1501 98.9 F (37.2 C)     Temp Source 04/07/22 1501 Oral     SpO2 04/07/22 1501 97 %     Weight 04/07/22 1503 244 lb (110.7 kg)     Height 04/07/22 1503 '5\' 8"'$  (1.727 m)     Head Circumference --      Peak Flow --      Pain Score 04/07/22 1503 6     Pain Loc --      Pain Edu? --      Excl. in [place]? --    No data found.  Updated Vital Signs BP 122/87 (BP Location: Right Arm)   Pulse 82   Temp 98.9 F (37.2 C) (Oral)   Resp 18   Ht '5\' 8"'$  (1.727 m)   Wt 110.7 kg   SpO2 97%   BMI 37.10 kg/m   Visual Acuity Right Eye Distance:   Left Eye Distance:   Bilateral Distance:    Right Eye Near:   Left Eye Near:    Bilateral Near:     Physical Exam Vitals and nursing note reviewed.  Constitutional:       General: She is not in acute distress. HENT:     Right Ear: Tympanic membrane, ear canal and external ear normal.     Left Ear: Tympanic membrane, ear canal and external ear normal.     Nose: Nose normal.     Mouth/Throat:     Mouth: Mucous membranes are moist. No lacerations.     Dentition: Normal dentition. No dental tenderness or gum lesions.     Palate: No mass.     Pharynx: Oropharynx is clear.     Comments: Patient has erythema on the left inferior aspect of her tongue without distinct ulceration or laceration.  There is mild tenderness to palpation.  Gingiva and teeth appear normal. Eyes:     Conjunctiva/sclera: Conjunctivae normal.     Pupils: Pupils are equal, round, and reactive to light.  Neck:     Comments: There is mild tenderness over left lateral nodes without enlargement. Cardiovascular:     Rate and Rhythm: Normal rate and regular rhythm.     Heart sounds: Normal heart sounds.  Pulmonary:     Breath sounds: Normal breath sounds.  Musculoskeletal:     Cervical back: Neck supple.  Skin:    General: Skin is warm and dry.     Findings: No rash.  Neurological:     Mental Status: She is  alert and oriented to person, place, and time.      UC Treatments / Results  Labs (all labs ordered are listed, but only abnormal results are displayed) Labs Reviewed - No data to display  EKG   Radiology No results found.  Procedures Procedures (including critical care time)  Medications Ordered in UC Medications - No data to display  Initial Impression / Assessment and Plan / UC Course  I have reviewed the triage vital signs and the nursing notes.  Pertinent labs & imaging results that were available during my care of the patient were reviewed by me and considered in my medical decision making (see chart for details).    Patient' exam is rather benign.  Because of concern for oral infection will begin clindamycin. Followup with ENT if not resolved one week.  Final  Clinical Impressions(s) / UC Diagnoses   Final diagnoses:  Mouth ulcer     Discharge Instructions      Try warm salt water gargles for sore throat. May take Tylenol as needed for pain. Recommend taking a probiotic (such as yogurt with live culture) while taking clindamycin.    ED Prescriptions     Medication Sig Dispense Auth. Provider   clindamycin (CLEOCIN) 300 MG capsule Take one cap PO every 8 hours 15 capsule Kandra Nicolas, MD         Kandra Nicolas, MD 04/10/22 [phone]

## 2022-04-08 LAB — CERVICOVAGINAL ANCILLARY ONLY
Bacterial Vaginitis (gardnerella): POSITIVE — AB
Candida Glabrata: NEGATIVE
Candida Vaginitis: NEGATIVE
Chlamydia: NEGATIVE
Comment: NEGATIVE
Comment: NEGATIVE
Comment: NEGATIVE
Comment: NEGATIVE
Comment: NEGATIVE
Comment: NORMAL
Neisseria Gonorrhea: NEGATIVE
Trichomonas: NEGATIVE

## 2022-04-11 ENCOUNTER — Other Ambulatory Visit: Payer: Self-pay | Admitting: Obstetrics and Gynecology

## 2022-04-11 MED ORDER — METRONIDAZOLE 500 MG PO TABS: 500.0000 mg | ORAL_TABLET | Freq: Two times a day (BID) | ORAL | 0 refills | Status: AC

## 2022-04-14 ENCOUNTER — Ambulatory Visit (INDEPENDENT_AMBULATORY_CARE_PROVIDER_SITE_OTHER): Payer: Self-pay | Admitting: Family Medicine

## 2022-04-14 ENCOUNTER — Encounter: Payer: Self-pay | Admitting: Family Medicine

## 2022-04-14 VITALS — BP 111/66 | HR 84 | Ht 68.0 in | Wt 248.0 lb

## 2022-04-14 DIAGNOSIS — Z09 Encounter for follow-up examination after completed treatment for conditions other than malignant neoplasm: Secondary | ICD-10-CM

## 2022-04-14 NOTE — Progress Notes (Signed)
Post op for total hysterectomy done on 03/04/22.

## 2022-04-14 NOTE — Progress Notes (Signed)
   Subjective:    Patient ID: Judy Welch is a 46 y.o. female presenting with Routine Post Op  on 04/14/2022  HPI: s/p TVH on 03/04/2022  Had some leaking of urine and it seems to have lessened. Still with some bulging, but this seems far less.  Review of Systems  Constitutional:  Negative for chills and fever.  Respiratory:  Negative for shortness of breath.   Cardiovascular:  Negative for chest pain.  Gastrointestinal:  Negative for abdominal pain, nausea and vomiting.  Genitourinary:  Negative for dysuria.  Skin:  Negative for rash.      Objective:    BP 111/66   Pulse 84   Ht '5\' 8"'$  (1.727 m)   Wt 248 lb (112.5 kg)   BMI 37.71 kg/m  Physical Exam Exam conducted with a chaperone present.  Constitutional:      General: She is not in acute distress.    Appearance: She is well-developed.  HENT:     Head: Normocephalic and atraumatic.  Eyes:     General: No scleral icterus. Cardiovascular:     Rate and Rhythm: Normal rate.  Pulmonary:     Effort: Pulmonary effort is normal.  Abdominal:     Palpations: Abdomen is soft.  Genitourinary:    Comments: Well healed vaginal cuff, no discharge, no visible urine in vagina. Good anterior support. Musculoskeletal:     Cervical back: Neck supple.  Skin:    General: Skin is warm and dry.  Neurological:     Mental Status: She is alert and oriented to person, place, and time.         Assessment & Plan:  Postop check - Doing well. May resume normal activity.   Return if symptoms worsen or fail to improve.  [name], MD 04/14/2022 11:37 AM

## 2022-04-21 ENCOUNTER — Inpatient Hospital Stay (HOSPITAL_COMMUNITY): Admission: RE | Admit: 2022-04-21 | Payer: Medicaid Other | Source: Ambulatory Visit

## 2022-05-16 ENCOUNTER — Ambulatory Visit
Admission: EM | Admit: 2022-05-16 | Discharge: 2022-05-16 | Disposition: A | Discharge status: Home / Self Care | Payer: Medicaid Other | Attending: Urgent Care | Admitting: Urgent Care

## 2022-05-16 ENCOUNTER — Encounter: Payer: Self-pay | Admitting: Emergency Medicine

## 2022-05-16 DIAGNOSIS — K120 Recurrent oral aphthae: Secondary | ICD-10-CM

## 2022-05-16 DIAGNOSIS — F1010 Alcohol abuse, uncomplicated: Secondary | ICD-10-CM

## 2022-05-16 MED ORDER — TRIAMCINOLONE ACETONIDE 0.1 % MT PSTE: 1.0000 | PASTE | Freq: Two times a day (BID) | OROMUCOSAL | 0 refills | Status: AC

## 2022-05-16 NOTE — Discharge Instructions (Signed)
You have a tongue ulcer.  Please apply topical triamcinolone dental paste to the affected area of tongue twice daily until resolved. Please establish care with a dentist for routine oral health screenings.  To help you abstain from alcohol, please call the phone number on the handout provided to you for the chemical dependency intensive outpatient program.

## 2022-05-16 NOTE — ED Provider Notes (Signed)
[name]    CSN: [id number] Arrival date & time: 05/16/22  1452      History   Chief Complaint Chief Complaint  Patient presents with   Oral Swelling    HPI Judy Welch is a 46 y.o. female.   46 year old female presents today due to concerns of an ulcer on the right side of her tongue.  She had a similar issue on August 14, which she states resolved.  This new ulcer has been present for the past several days.  She is present today with mom due to concerns that there was some oral swelling as well.  She has not tried any over-the-counter or topical treatments to this.  She denies any dysphagia, lip swelling, sore throat, lymphadenopathy.  No headache or fever.  Patient admits to being a chronic alcoholic.  She is trying to cut back.  She has not had any alcohol since yesterday.  She does follow with behavioral health and is trying to stop drinking completely.  She has been taking her Ativan to help with the side effects.  She denies any seizure-like activity.  Has been able to stop in the past.     Past Medical History:  Diagnosis Date   Alcohol abuse    pt reports "quit dirnking 08/29/2016 with brief relapse in April 2023"- no longer drinks   Anemia    Anxiety    Atrial fibrillation (HCC)    Breast disorder    left breast lump   Depression    Endocarditis    Fibrillation, atrial (HCC)    Heart murmur    pt reports being told she had a heart murmur in the past   Insomnia disorder    Mental disorder    anxiety   Ovarian cyst    Pneumonia    July 2022   PTSD (post-traumatic stress disorder)    Pulmonary embolism ([place])    2020   Septic arthritis ([place])    right hip   Septic embolism ([place])    2020   Septic shock ([place])    SVT (supraventricular tachycardia) ([place])    Tricuspid valve disorder    Vaginal Pap smear, abnormal     Patient Active Problem List   Diagnosis Date Noted   Status post hysterectomy 03/04/2022   Anticoagulant long-term use  12/20/2020   Abnormal uterine bleeding (AUB) 11/14/2020   Generalized anxiety disorder with panic attacks 11/13/2020   MDD (major depressive disorder), recurrent episode, moderate ([place]) 11/13/2020   Primary insomnia 11/13/2020   Arthritis of right hip 05/03/2020   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   History of alcohol use disorder 07/02/2019   Hypokalemia 06/08/2019   Microcytic anemia 06/08/2019   Paroxysmal atrial fibrillation ([place]) 06/07/2019   Pulmonary embolus ([place]) 06/07/2019   Chronic pain syndrome 03/08/2018   SI (sacroiliac) pain 08/19/2017   Neuropathy, alcoholic ([place]) 34/19/6222   Anemia 09/26/2013    Past Surgical History:  Procedure Laterality Date   CARDIAC ELECTROPHYSIOLOGY MAPPING AND ABLATION     CARDIAC ELECTROPHYSIOLOGY STUDY AND ABLATION     CARPAL TUNNEL RELEASE Right    CHOLECYSTECTOMY     HIP SURGERY     THORACENTESIS Left    TOTAL HIP ARTHROPLASTY Right    TRICUSPID VALVE REPLACEMENT     VAGINAL HYSTERECTOMY Bilateral 03/04/2022   Procedure: HYSTERECTOMY VAGINAL WITH SALPINGECTOMY;  Surgeon: [name], MD;  Location: [facility];  Service: Gynecology;  Laterality: Bilateral;    OB History     Gravida  0   Para  0   Term  0   Preterm  0   AB  0   Living  0      SAB  0   IAB  0   Ectopic  0   Multiple  0   Live Births  0            Home Medications    Prior to Admission medications   Medication Sig Start Date End Date Taking? Authorizing Provider  buPROPion (WELLBUTRIN XL) 150 MG 24 hr tablet Take 150 mg by mouth See admin instructions. Tale with 300 mg for a total of 450 mg in the morning   Yes [provider]  buPROPion (WELLBUTRIN XL) 300 MG 24 hr tablet Take 300 mg by mouth See admin instructions. Take with 150 mg for a total of 450 mg in the morning 01/13/22  Yes [provider]  cyclobenzaprine (FLEXERIL) 10 MG tablet Take 10 mg by mouth 3 (three)  times daily as needed for muscle spasms. 01/21/16  Yes [provider]  docusate sodium (COLACE) 100 MG capsule Take 1 capsule (100 mg total) by mouth 2 (two) times daily as needed. Patient taking differently: Take 100 mg by mouth 2 (two) times daily. 02/04/21  Yes [name], MD  dofetilide (TIKOSYN) 250 MCG capsule Take 250 mcg by mouth 2 (two) times daily. 07/12/20  Yes [provider]  fluticasone (FLONASE) 50 MCG/ACT nasal spray Place 2 sprays into both nostrils daily as needed for allergies.   Yes [provider]  furosemide (LASIX) 20 MG tablet Take 1 tablet (20 mg total) by mouth as needed. 02/04/21  Yes [name], MD  hydrOXYzine (ATARAX) 50 MG tablet Take 25-50 mg by mouth daily as needed for anxiety or itching. 01/13/22  Yes [provider]  LORazepam (ATIVAN) 1 MG tablet Take 1 mg by mouth 4 (four) times daily as needed for anxiety (Panic attack). 12/12/20  Yes [provider]  omeprazole (PRILOSEC OTC) 20 MG tablet Take 2 tablets (40 mg total) by mouth daily. 02/04/21  Yes [name], MD  potassium chloride (MICRO-K) 10 MEQ CR capsule Take 1 capsule (10 mEq total) by mouth as needed. 02/04/21  Yes [name], MD  pregabalin (LYRICA) 200 MG capsule Take 1 capsule (200 mg total) by mouth in the morning, at noon, and at bedtime. 02/04/21  Yes [name], MD  Pyridoxine HCl (VITAMIN B-6 CR) 200 MG TBCR Take 200 mg by mouth daily.   Yes [provider]  rivaroxaban (XARELTO) 20 MG TABS tablet Take 1 tablet (20 mg total) by mouth daily with supper. 02/04/21  Yes [name], MD  rosuvastatin (CRESTOR) 10 MG tablet Take 10 mg by mouth every morning.   Yes [provider]  sertraline (ZOLOFT) 100 MG tablet Take 2 tablets (200 mg total) by mouth daily. 02/04/21  Yes [name], MD  triamcinolone (KENALOG) 0.1 % paste Use as directed 1 Application in the mouth or throat 2 (two) times daily. 05/16/22  Yes Melondy Blanchard  L, PA  zolpidem (AMBIEN) 10 MG tablet Take 10 mg by mouth at bedtime as needed for sleep. 12/12/20  Yes [provider]  clindamycin (CLEOCIN) 300 MG capsule Take one cap PO every 8 hours Patient not taking: Reported on 04/14/2022 04/07/22   Kandra Nicolas, MD  ferrous  sulfate 325 (65 FE) MG tablet Take 325 mg by mouth daily with breakfast.    [provider]    Family History Family History  Problem Relation Age of Onset   Diabetes Father    Hypertension Father    Cancer Mother        bladder   Cancer Maternal Aunt        breast   Stroke Neg Hx     Social History     Allergies   Sulfa antibiotics and Sulfamethoxazole-trimethoprim   Review of Systems Review of Systems As per HPI  Physical Exam Triage Vital Signs ED Triage Vitals  Enc Vitals Group     BP 05/16/22 1506 127/86     Pulse Rate 05/16/22 1506 89     Resp 05/16/22 1506 18     Temp 05/16/22 1506 98.9 F (37.2 C)     Temp Source 05/16/22 1506 Oral     SpO2 05/16/22 1506 95 %     Weight 05/16/22 1507 248 lb 0.3 oz (112.5 kg)     Height 05/16/22 1507 '5\' 8"'$  (1.727 m)     Head Circumference --      Peak Flow --      Pain Score 05/16/22 1507 4     Pain Loc --      Pain Edu? --      Excl. in [place]? --    No data found.  Updated Vital Signs BP 127/86 (BP Location: Left Arm)   Pulse 89   Temp 98.9 F (37.2 C) (Oral)   Resp 18   Ht '5\' 8"'$  (1.727 m)   Wt 248 lb 0.3 oz (112.5 kg)   SpO2 95%   BMI 37.71 kg/m   Visual Acuity Right Eye Distance:   Left Eye Distance:   Bilateral Distance:    Right Eye Near:   Left Eye Near:    Bilateral Near:     Physical Exam Vitals and nursing note reviewed. Exam conducted with a chaperone present.  Constitutional:      General: She is not in acute distress.    Appearance: Normal appearance. She is well-developed. She is obese. She is not ill-appearing, toxic-appearing or diaphoretic.  HENT:     Head: Normocephalic and atraumatic.     Jaw: There  is normal jaw occlusion. No trismus, tenderness, swelling, pain on movement or malocclusion.     Salivary Glands: Right salivary gland is not diffusely enlarged or tender. Left salivary gland is not diffusely enlarged or tender.     Right Ear: Ear canal and external ear normal.     Left Ear: Ear canal and external ear normal.     Nose: Nose normal. No nasal tenderness or rhinorrhea.     Right Nostril: No occlusion.     Left Nostril: No occlusion.     Mouth/Throat:     Lips: Pink.     Mouth: No angioedema.     Dentition: Abnormal dentition. No dental abscesses or gum lesions.     Tongue: Lesions (single ulcer to R lateral tongue) present.     Palate: No mass and lesions.     Pharynx: Oropharynx is clear. Uvula midline. No pharyngeal swelling, oropharyngeal exudate, posterior oropharyngeal erythema or uvula swelling.     Tonsils: No tonsillar exudate or tonsillar abscesses.   Eyes:     Conjunctiva/sclera: Conjunctivae normal.  Cardiovascular:     Rate and Rhythm: Normal rate and regular rhythm.     Heart  sounds: No murmur heard. Pulmonary:     Effort: Pulmonary effort is normal. No respiratory distress.     Breath sounds: Normal breath sounds.  Abdominal:     Palpations: Abdomen is soft.     Tenderness: There is no abdominal tenderness.  Musculoskeletal:        General: No swelling.     Cervical back: Neck supple.  Skin:    General: Skin is warm and dry.     Capillary Refill: Capillary refill takes less than 2 seconds.  Neurological:     Mental Status: She is alert.  Psychiatric:        Mood and Affect: Mood normal.      UC Treatments / Results  Labs (all labs ordered are listed, but only abnormal results are displayed) Labs Reviewed - No data to display  EKG   Radiology No results found.  Procedures Procedures (including critical care time)  Medications Ordered in UC Medications - No data to display  Initial Impression / Assessment and Plan / UC Course  I  have reviewed the triage vital signs and the nursing notes.  Pertinent labs & imaging results that were available during my care of the patient were reviewed by me and considered in my medical decision making (see chart for details).     Aphthous ulcer on tongue -there is no sign of dental infection or abscess.  Patient was concerned about possible swelling on the right side of her face, but I do not appreciate that upon exam today.  No palpable lymphadenopathy.  We will do topical triamcinolone dental paste to help resolve the ulcer.  Did recommend patient follow-up with dental specialist for oral health screenings. Alcohol abuse -patient trying to cut back/abstain from drinking alcohol.  Currently on Ativan.  Vital signs stable.  Recommended she go to the outpatient chemical dependency rehab to help stop in a safe and healthy manner.    Final Clinical Impressions(s) / UC Diagnoses   Final diagnoses:  Aphthous ulcer of tongue  Alcohol abuse     Discharge Instructions      You have a tongue ulcer.  Please apply topical triamcinolone dental paste to the affected area of tongue twice daily until resolved. Please establish care with a dentist for routine oral health screenings.  To help you abstain from alcohol, please call the phone number on the handout provided to you for the chemical dependency intensive outpatient program.     ED Prescriptions     Medication Sig Dispense Auth. Provider   triamcinolone (KENALOG) 0.1 % paste Use as directed 1 Application in the mouth or throat 2 (two) times daily. 5 g Niko Jakel L, [place]      PDMP not reviewed this encounter.   Chaney Malling, [place] 05/16/22 1539

## 2022-05-16 NOTE — ED Triage Notes (Signed)
Patient c/o right sided tongue pain x 1 month, states theres a sore on that side, along with right sided lymph node swelling.  Right ear pain and sinus pain.  Patient denies any OTC pain meds.

## 2022-05-17 ENCOUNTER — Telehealth: Payer: Self-pay

## 2022-05-17 NOTE — Telephone Encounter (Signed)
Tried to call pt to check on status since UC visit. No answer and no VM.

## 2022-06-29 IMAGING — DX DG CHEST 2V
2 series · 2 of 2 positions shown · non-contrast
Comparison: January 31, 2018.

CLINICAL DATA: Cough.

EXAM:
CHEST - 2 VIEW

[chest pa]
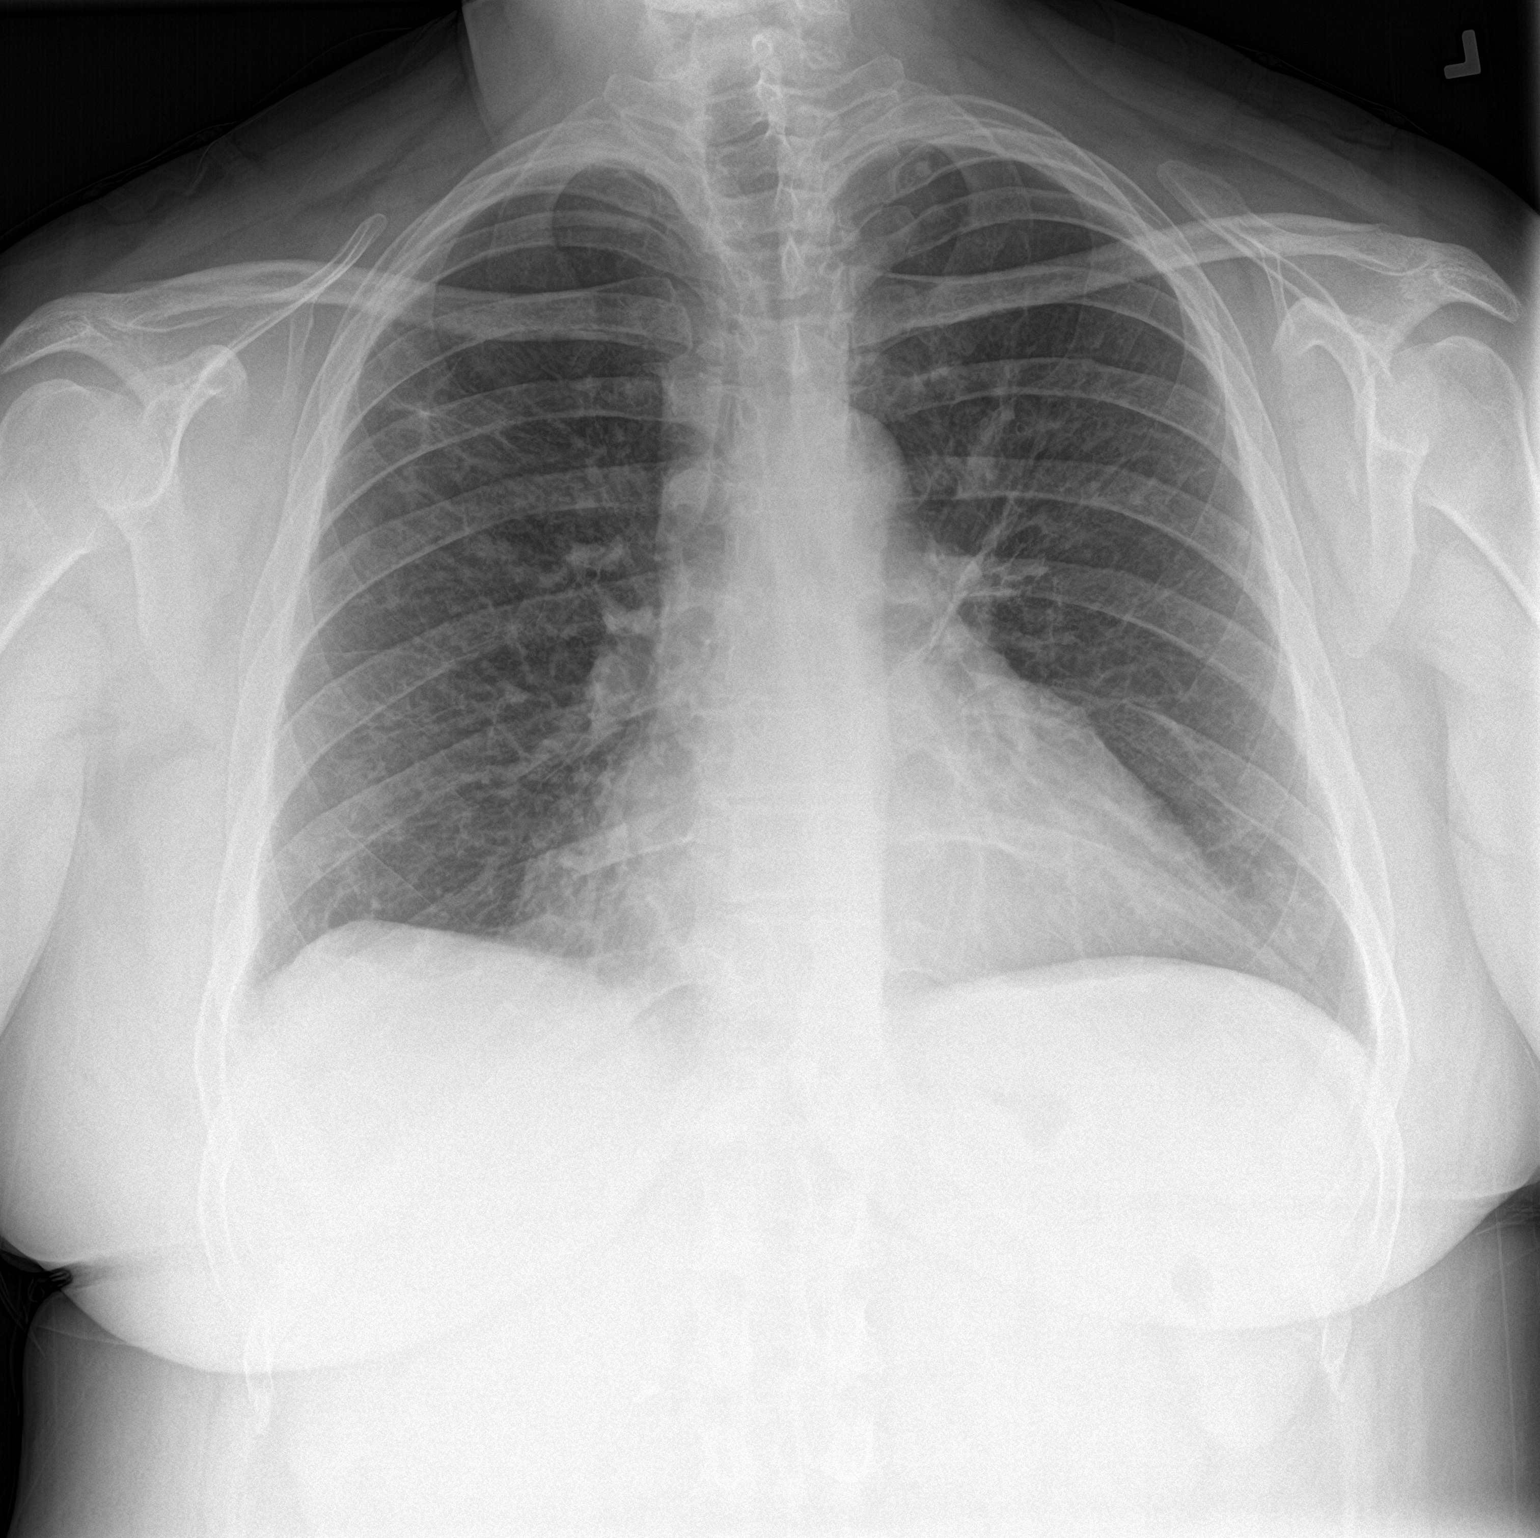

[chest lat]
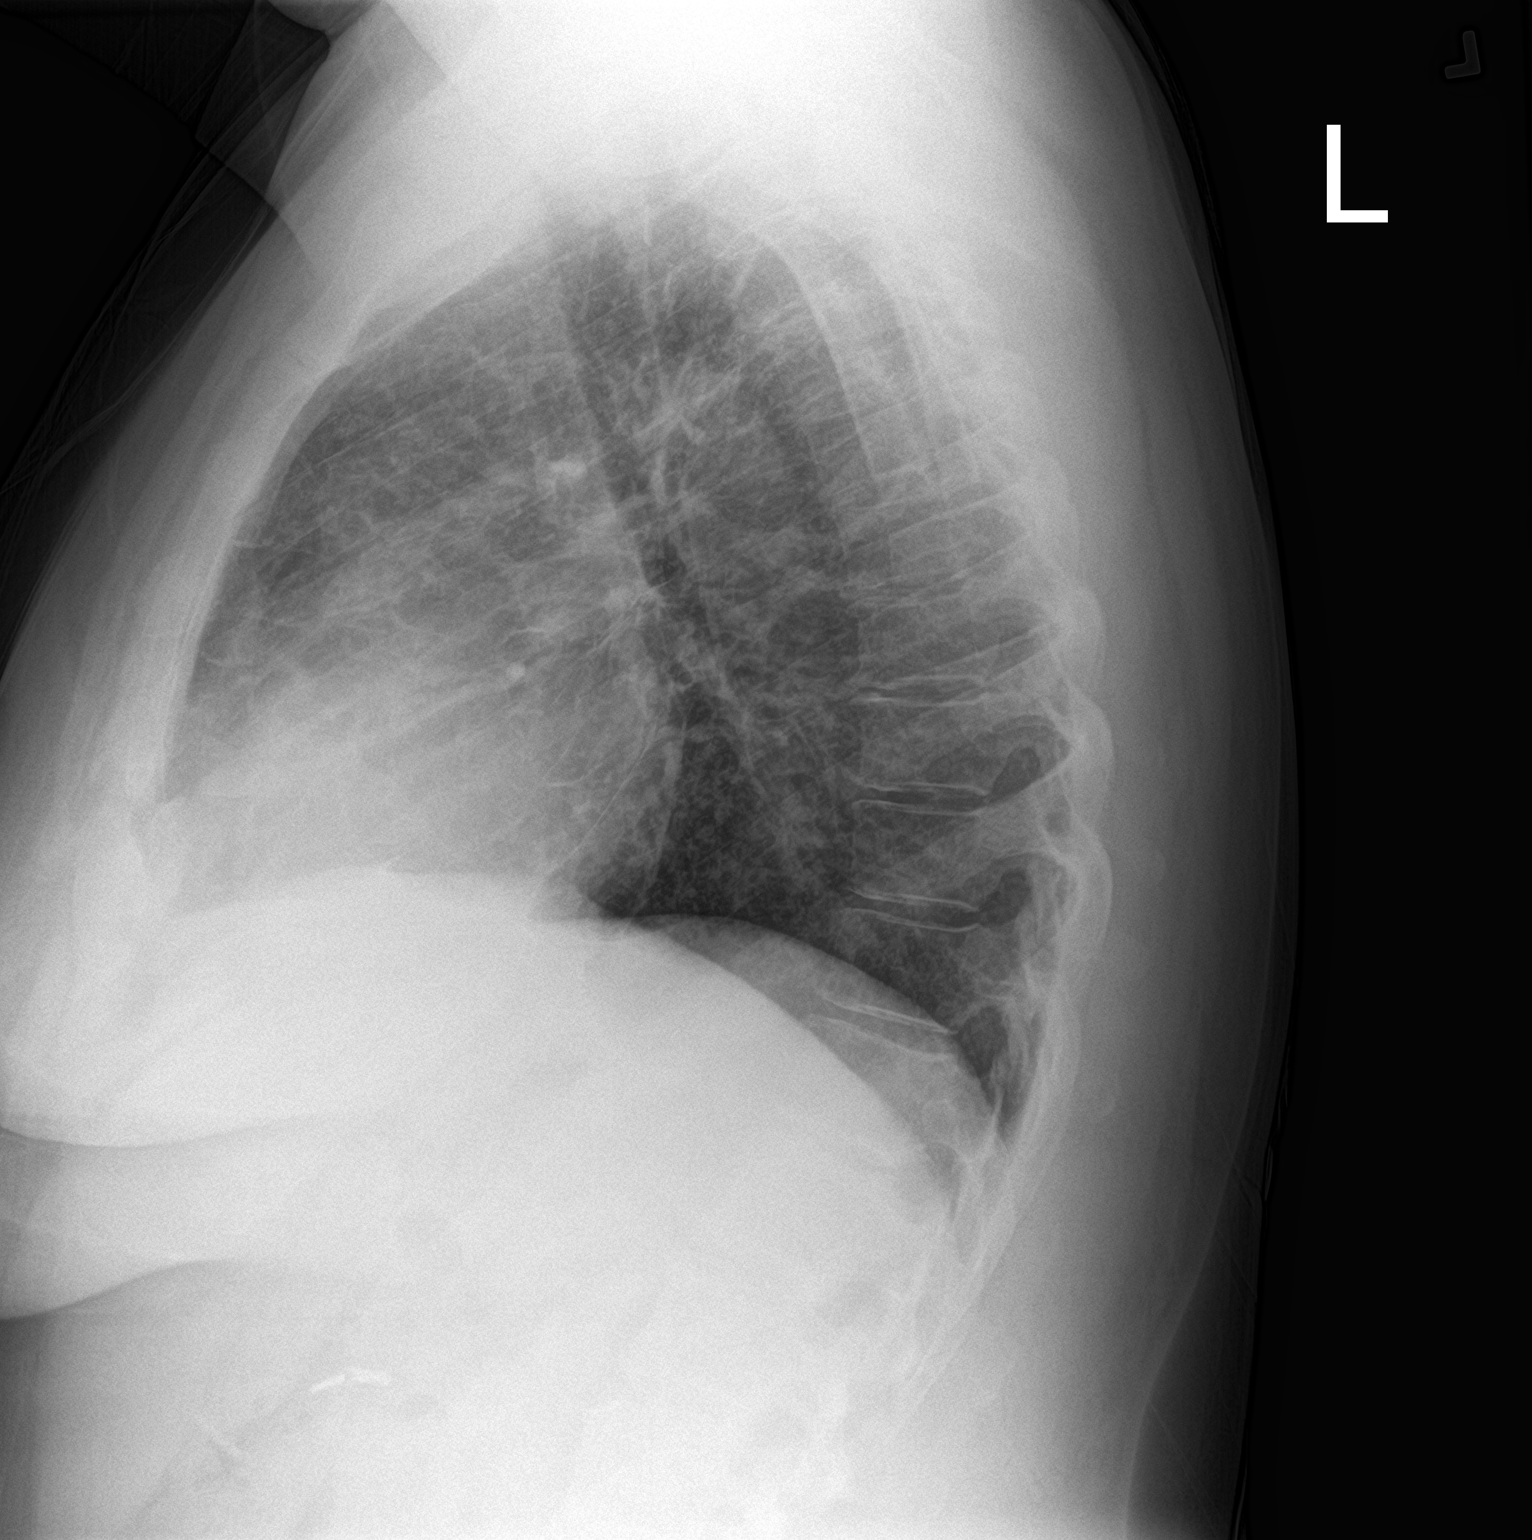

[2 of 2 positions shown; findings below may reference images not displayed]

FINDINGS: The heart size and mediastinal contours are within normal limits.
Small left apical nodule is noted. Minimal scarring or atelectasis
is noted in right upper lobe. The visualized skeletal structures
are unremarkable.
IMPRESSION: Minimal scarring or atelectasis seen in right upper lobe. Small left
apical nodule is noted; CT scan of the chest is recommended for
further evaluation.

## 2022-06-29 IMAGING — CT CT CHEST W/O CM
2 of 4 series · 15 of 36 positions shown, 18 images · non-contrast
Comparison: CT chest 01/31/2018

CLINICAL DATA: Cough

Lung nodule seen at left apex.
EXAM:
CT CHEST WITHOUT CONTRAST
TECHNIQUE: Multidetector CT imaging of the chest was performed following the
standard protocol without IV contrast.

[Series 2: thorax · axial · 0.65mm/px · z∈[-310,-66]mm · 12 of 146 slices shown, 15 images]
[im 12/146  mediastinal]
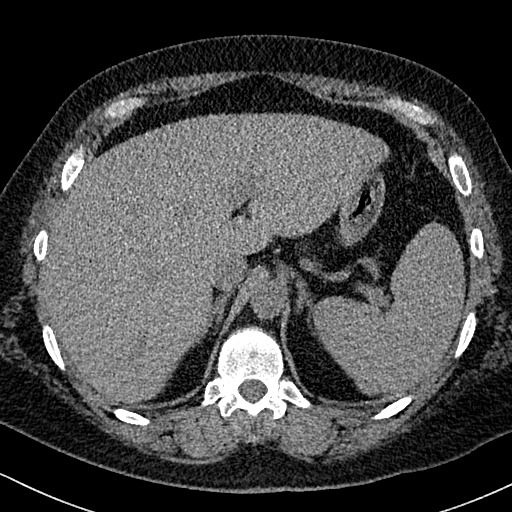
[im 12/146  lung]
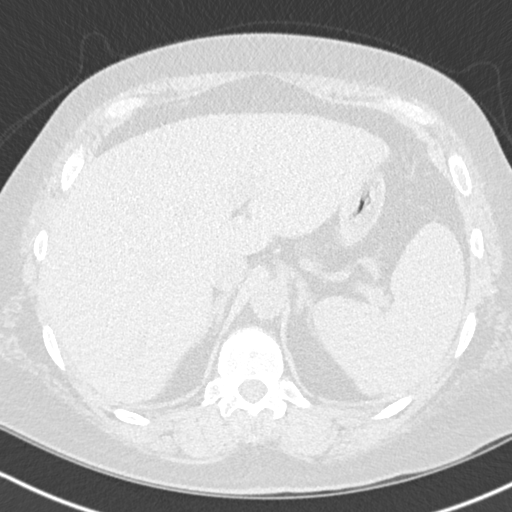
[im 23/146  lung]
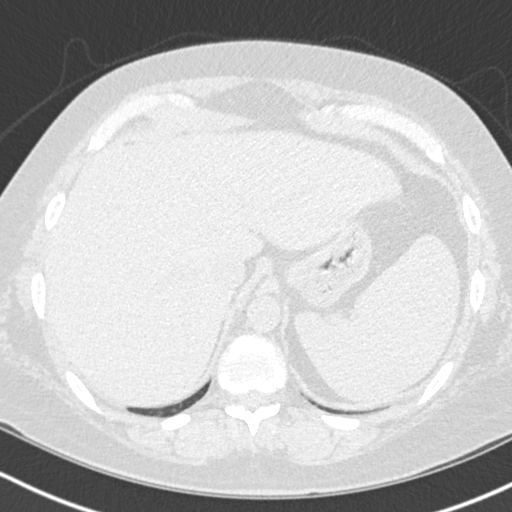
[im 34/146  lung]
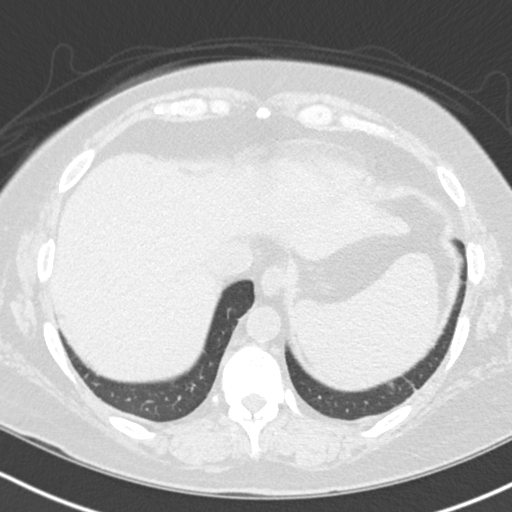
[im 45/146  lung]
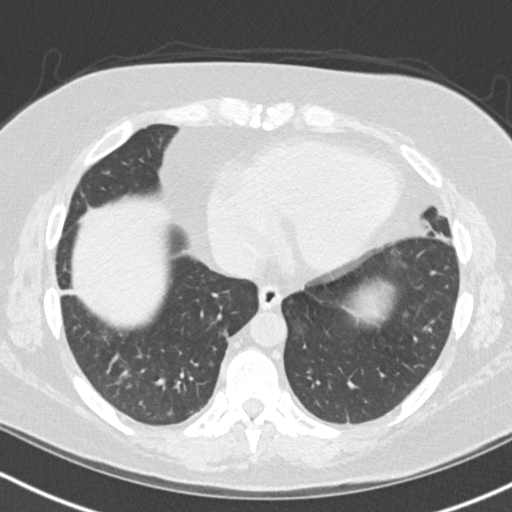
[im 56/146  mediastinal]
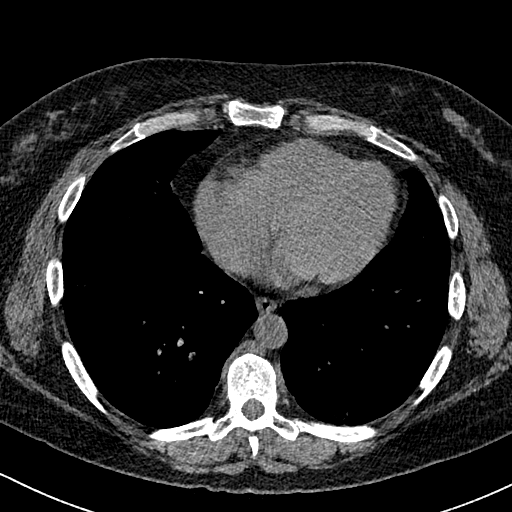
[im 56/146  lung]
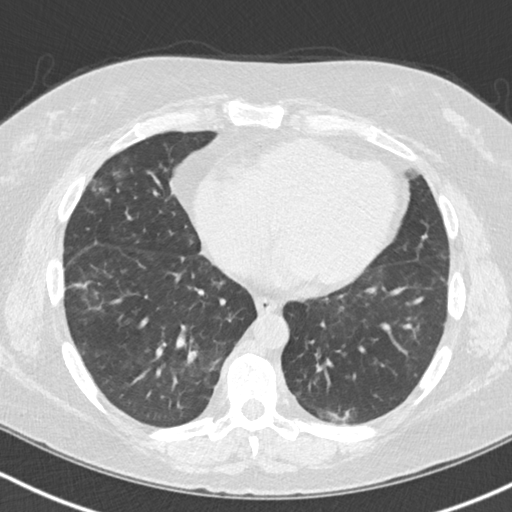
[im 67/146  lung]
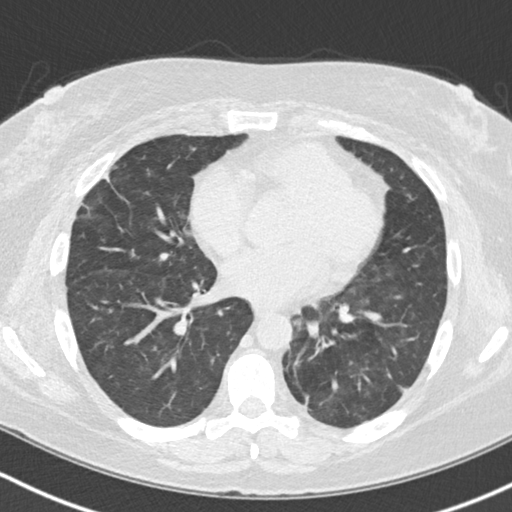
[im 79/146  lung]
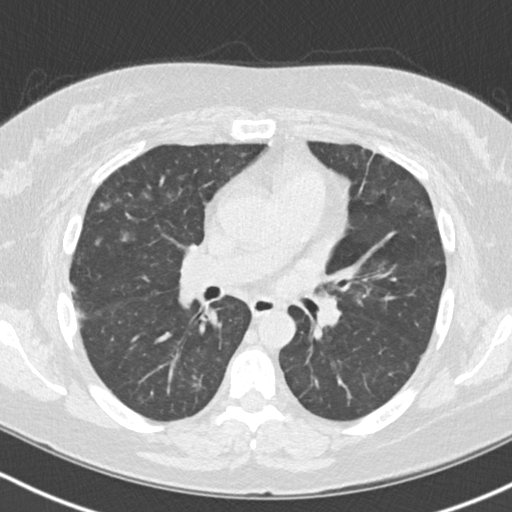
[im 90/146  lung]
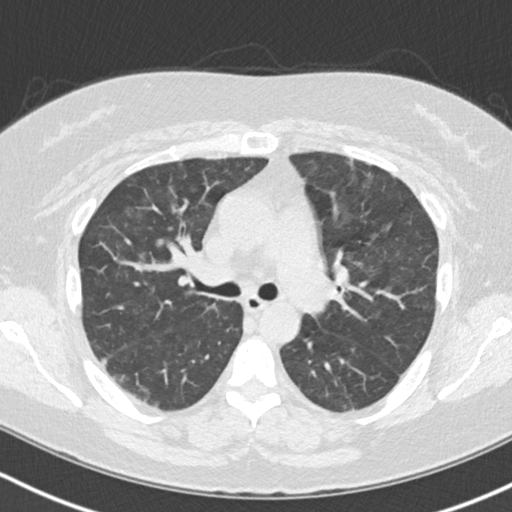
[im 101/146  mediastinal]
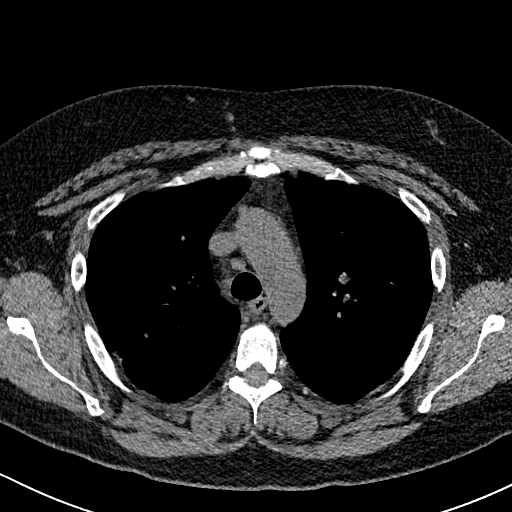
[im 101/146  lung]
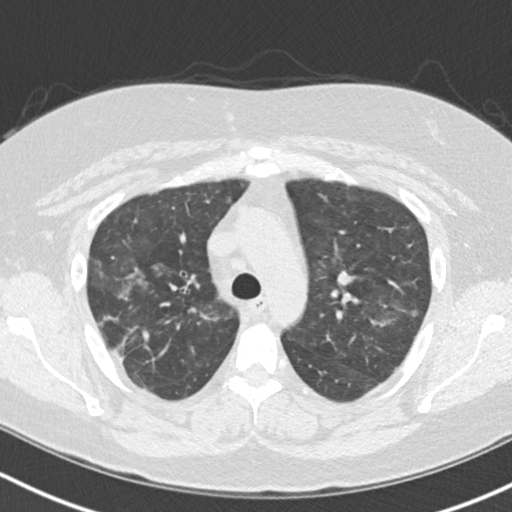
[im 112/146  lung]
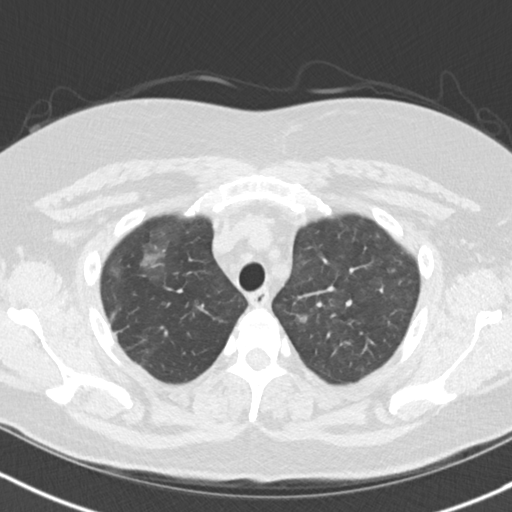
[im 123/146  lung]
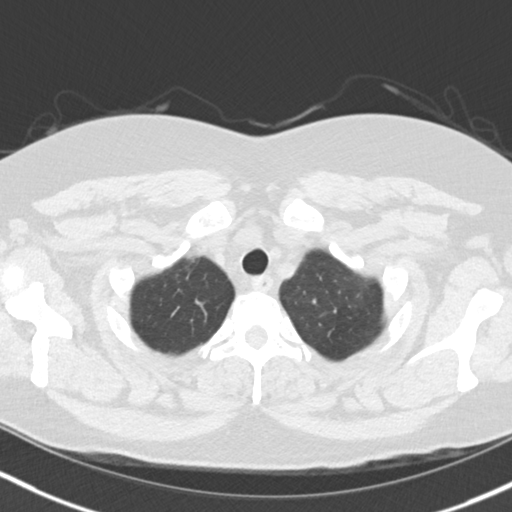
[im 134/146  lung]
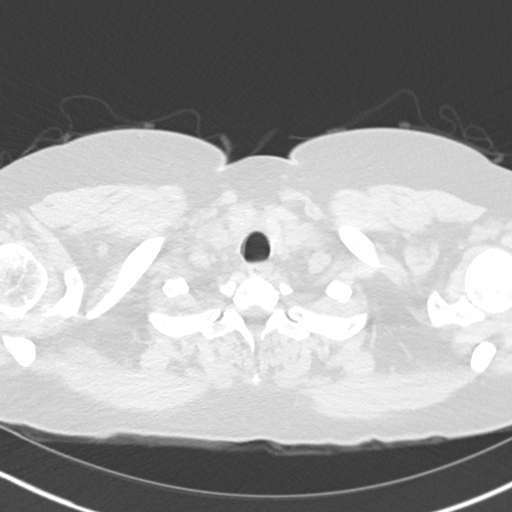

[Series 5: coronal · coronal · 0.58mm/px · 3 of 127 slices shown]
[im 26/127  lung]
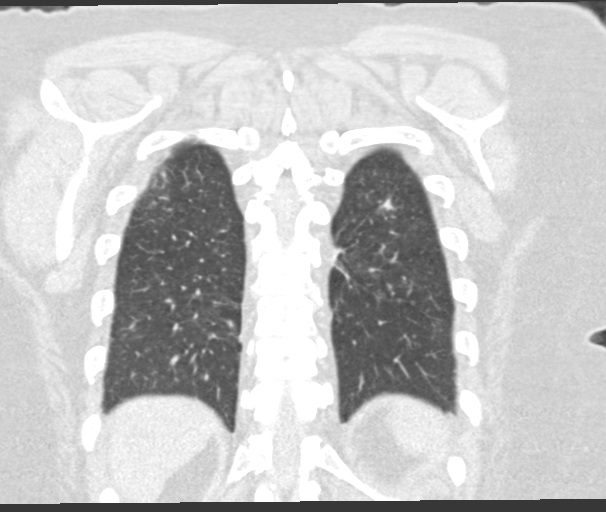
[im 51/127  lung]
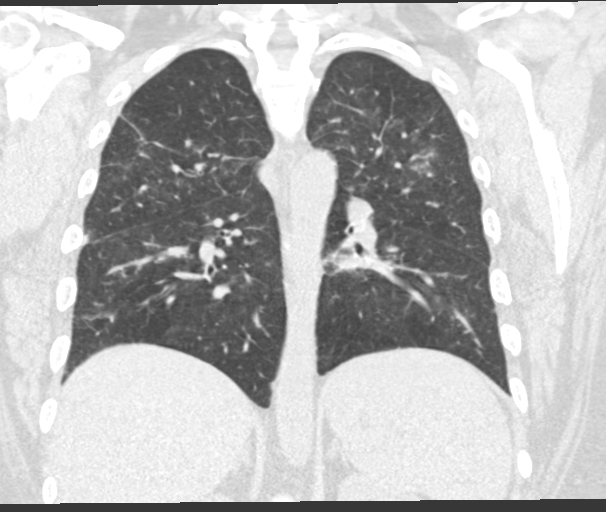
[im 76/127  lung]
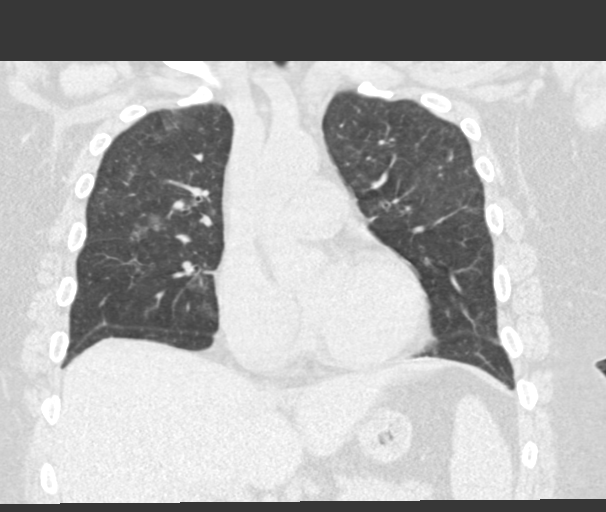

[15 of 36 positions shown; findings below may reference images not displayed]

FINDINGS: Cardiovascular: Heart size within normal limits. No pericardial
effusion.

Mediastinum/Nodes: No enlarged axillary or supraclavicular lymph
nodes. Nonenlarged lymph nodes noted throughout the mediastinum,
similar to prior exam.

Lungs/Pleura: Bilateral ground-glass opacities most likely due to
infectious pneumonitis. No pleural effusion

Upper Abdomen: Spleen appears enlarged and has increased in size
since prior exam. No acute abnormality abdomen.

Musculoskeletal: Nodule seen recent chest x-ray corresponds to a
bone island the posterior segment of the left third rib.
IMPRESSION: 1. Nodule seen on recent chest x-ray corresponds to bone island in
posterior left third rib.
2. Bilateral ground-glass opacities most likely due to infectious
pneumonitis.
3. Apparent splenomegaly. Further evaluation with left upper
quadrant ultrasound should be performed to better determine spleen
volume.

## 2022-08-10 ENCOUNTER — Ambulatory Visit
Admission: EM | Admit: 2022-08-10 | Discharge: 2022-08-10 | Disposition: A | Discharge status: Home / Self Care | Payer: Medicare Other | Attending: Family Medicine | Admitting: Family Medicine

## 2022-08-10 ENCOUNTER — Other Ambulatory Visit: Payer: Self-pay

## 2022-08-10 DIAGNOSIS — T7840XA Allergy, unspecified, initial encounter: Secondary | ICD-10-CM

## 2022-08-10 MED ORDER — PREDNISONE 10 MG (21) PO TBPK: ORAL_TABLET | Freq: Every day | ORAL | 0 refills | Status: DC

## 2022-08-10 NOTE — Discharge Instructions (Addendum)
Advised patient to take medication as directed with food to completion.  Encouraged patient increase daily water intake to 64 ounces per day while taking this medication.  Advised if symptoms worsen and/or unresolved please follow-up with PCP or here for further evaluation.

## 2022-08-10 NOTE — ED Triage Notes (Addendum)
Pt presents to Urgent Care with c/o redness and swelling to bilateral deltoids where COVID and flu vaccines were administered on 08/08/22. No problems breathing or w/ oral swelling.

## 2022-08-10 NOTE — ED Provider Notes (Signed)
[facility]    CSN: [id number] Arrival date & time: 08/10/22  1139      History   Chief Complaint Chief Complaint  Patient presents with   Allergic Reaction    HPI Judy Welch is a 46 y.o. female.   HPI 46 year old female presents with localized injection reactions of COVID and flu vaccines administered on 08/08/2022. PMH significant for morbid obesity, atrial fibrillation, depression, chronic pain syndrome and alcohol abuse.  Past Medical History:  Diagnosis Date   Alcohol abuse    pt reports "quit dirnking 08/29/2016 with brief relapse in April 2023"- no longer drinks   Anemia    Anxiety    Atrial fibrillation (HCC)    Breast disorder    left breast lump   Depression    Endocarditis    Fibrillation, atrial (HCC)    Heart murmur    pt reports being told she had a heart murmur in the past   Insomnia disorder    Mental disorder    anxiety   Ovarian cyst    Pneumonia    July 2022   PTSD (post-traumatic stress disorder)    Pulmonary embolism ([place])    2020   Septic arthritis ([place])    right hip   Septic embolism ([place])    2020   Septic shock (HCC)    SVT (supraventricular tachycardia)    Tricuspid valve disorder    Vaginal Pap smear, abnormal     Patient Active Problem List   Diagnosis Date Noted   Status post hysterectomy 03/04/2022   Anticoagulant long-term use 12/20/2020   Abnormal uterine bleeding (AUB) 11/14/2020   Generalized anxiety disorder with panic attacks 11/13/2020   MDD (major depressive disorder), recurrent episode, moderate ([place]) 11/13/2020   Primary insomnia 11/13/2020   Arthritis of right hip 05/03/2020   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   History of alcohol use disorder 07/02/2019   Hypokalemia 06/08/2019   Microcytic anemia 06/08/2019   Paroxysmal atrial fibrillation ([place]) 06/07/2019   Pulmonary embolus ([place]) 06/07/2019   Chronic pain syndrome 03/08/2018   SI  (sacroiliac) pain 08/19/2017   Neuropathy, alcoholic ([place]) 09/62/8366   Anemia 09/26/2013    Past Surgical History:  Procedure Laterality Date   CARDIAC ELECTROPHYSIOLOGY MAPPING AND ABLATION     CARDIAC ELECTROPHYSIOLOGY STUDY AND ABLATION     CARPAL TUNNEL RELEASE Right    CHOLECYSTECTOMY     HIP SURGERY     THORACENTESIS Left    TOTAL HIP ARTHROPLASTY Right    TRICUSPID VALVE REPLACEMENT     VAGINAL HYSTERECTOMY Bilateral 03/04/2022   Procedure: HYSTERECTOMY VAGINAL WITH SALPINGECTOMY;  Surgeon: [name], MD;  Location: [facility];  Service: Gynecology;  Laterality: Bilateral;    OB History     Gravida  0   Para  0   Term  0   Preterm  0   AB  0   Living  0      SAB  0   IAB  0   Ectopic  0   Multiple  0   Live Births  0            Home Medications    Prior to Admission medications   Medication Sig Start Date End Date Taking? Authorizing Provider  predniSONE (STERAPRED UNI-PAK 21 TAB) 10 MG (21) TBPK tablet Take by mouth daily. Take 6 tabs by mouth daily  for 2 days, then  5 tabs for 2 days, then 4 tabs for 2 days, then 3 tabs for 2 days, 2 tabs for 2 days, then 1 tab by mouth daily for 2 days 08/10/22  Yes Eliezer Lofts, FNP  buPROPion (WELLBUTRIN XL) 150 MG 24 hr tablet Take 150 mg by mouth See admin instructions. Tale with 300 mg for a total of 450 mg in the morning    [provider]  buPROPion (WELLBUTRIN XL) 300 MG 24 hr tablet Take 300 mg by mouth See admin instructions. Take with 150 mg for a total of 450 mg in the morning 01/13/22   [provider]  clindamycin (CLEOCIN) 300 MG capsule Take one cap PO every 8 hours Patient not taking: Reported on 04/14/2022 04/07/22   Kandra Nicolas, MD  cyclobenzaprine (FLEXERIL) 10 MG tablet Take 10 mg by mouth 3 (three) times daily as needed for muscle spasms. 01/21/16   [provider]  docusate sodium (COLACE) 100 MG capsule Take 1 capsule (100 mg total) by mouth 2 (two) times  daily as needed. Patient taking differently: Take 100 mg by mouth 2 (two) times daily. 02/04/21   [name], MD  dofetilide (TIKOSYN) 250 MCG capsule Take 250 mcg by mouth 2 (two) times daily. 07/12/20   [provider]  ferrous sulfate 325 (65 FE) MG tablet Take 325 mg by mouth daily with breakfast.    [provider]  fluticasone (FLONASE) 50 MCG/ACT nasal spray Place 2 sprays into both nostrils daily as needed for allergies.    [provider]  furosemide (LASIX) 20 MG tablet Take 1 tablet (20 mg total) by mouth as needed. 02/04/21   [name], MD  hydrOXYzine (ATARAX) 50 MG tablet Take 25-50 mg by mouth daily as needed for anxiety or itching. 01/13/22   [provider]  LORazepam (ATIVAN) 1 MG tablet Take 1 mg by mouth 4 (four) times daily as needed for anxiety (Panic attack). 12/12/20   [provider]  omeprazole (PRILOSEC OTC) 20 MG tablet Take 2 tablets (40 mg total) by mouth daily. 02/04/21   [name], MD  potassium chloride (MICRO-K) 10 MEQ CR capsule Take 1 capsule (10 mEq total) by mouth as needed. 02/04/21   [name], MD  pregabalin (LYRICA) 200 MG capsule Take 1 capsule (200 mg total) by mouth in the morning, at noon, and at bedtime. 02/04/21   [name], MD  Pyridoxine HCl (VITAMIN B-6 CR) 200 MG TBCR Take 200 mg by mouth daily.    [provider]  rivaroxaban (XARELTO) 20 MG TABS tablet Take 1 tablet (20 mg total) by mouth daily with supper. 02/04/21   [name], MD  rosuvastatin (CRESTOR) 10 MG tablet Take 10 mg by mouth every morning.    [provider]  sertraline (ZOLOFT) 100 MG tablet Take 2 tablets (200 mg total) by mouth daily. 02/04/21   [name], MD  triamcinolone (KENALOG) 0.1 % paste Use as directed 1 Application in the mouth or throat 2 (two) times daily. 05/16/22   Crain, Whitney L, PA  zolpidem (AMBIEN) 10 MG tablet Take 10 mg by mouth at bedtime as needed for sleep. 12/12/20    [provider]    Family History Family History  Problem Relation Age of Onset   Diabetes Father    Hypertension Father    Cancer Mother        bladder   Cancer Maternal Aunt  breast   Stroke Neg Hx     Social History Social History   Tobacco Use   Smoking status: Never   Smokeless tobacco: Never  Vaping Use   Vaping Use: Never used  Substance Use Topics   Alcohol use: Not Currently    Comment: pt reports alcohol abuse in the past (quit 08/29/2016)- last relapse April 2023, currently not drinking   Drug use: No     Allergies   Sulfa antibiotics and Sulfamethoxazole-trimethoprim   Review of Systems Review of Systems  Skin:  Positive for rash.     Physical Exam Triage Vital Signs ED Triage Vitals  Enc Vitals Group     BP 08/10/22 1333 131/83     Pulse Rate 08/10/22 1333 75     Resp 08/10/22 1333 20     Temp 08/10/22 1333 97.9 F (36.6 C)     Temp Source 08/10/22 1333 Oral     SpO2 08/10/22 1333 99 %     Weight 08/10/22 1324 250 lb (113.4 kg)     Height 08/10/22 1324 '5\' 8"'$  (1.727 m)     Head Circumference --      Peak Flow --      Pain Score 08/10/22 1324 4     Pain Loc --      Pain Edu? --      Excl. in [place]? --    No data found.  Updated Vital Signs BP 131/83 (BP Location: Right Arm)   Pulse 75   Temp 97.9 F (36.6 C) (Oral)   Resp 20   Ht '5\' 8"'$  (1.727 m)   Wt 250 lb (113.4 kg)   LMP 02/23/2022   SpO2 99%   BMI 38.01 kg/m    Physical Exam Vitals and nursing note reviewed.  Constitutional:      Appearance: Normal appearance. She is normal weight.  HENT:     Head: Normocephalic and atraumatic.     Mouth/Throat:     Mouth: Mucous membranes are moist.     Pharynx: Oropharynx is clear.  Eyes:     Extraocular Movements: Extraocular movements intact.     Conjunctiva/sclera: Conjunctivae normal.     Pupils: Pupils are equal, round, and reactive to light.  Cardiovascular:     Rate and Rhythm: Normal rate and regular rhythm.      Pulses: Normal pulses.     Heart sounds: Normal heart sounds.  Pulmonary:     Effort: Pulmonary effort is normal.     Breath sounds: Normal breath sounds. No wheezing, rhonchi or rales.  Musculoskeletal:        General: Normal range of motion.     Cervical back: Normal range of motion and neck supple.  Skin:    General: Skin is warm and dry.     Comments: IM injection sites appear to be off lower into the bicep rather than the deltoid please see images below areas are erythematous and indurated  Neurological:     General: No focal deficit present.     Mental Status: She is alert and oriented to person, place, and time.         UC Treatments / Results  Labs (all labs ordered are listed, but only abnormal results are displayed) Labs Reviewed - No data to display  EKG   Radiology No results found.  Procedures Procedures (including critical care time)  Medications Ordered in UC Medications - No data to display  Initial Impression / Assessment and Plan /  UC Course  I have reviewed the triage vital signs and the nursing notes.  Pertinent labs & imaging results that were available during my care of the patient were reviewed by me and considered in my medical decision making (see chart for details).     MDM: Allergic reaction to drug, initial encounter-Rx Sterapred Un[place]ak. Advised patient to take medication as directed with food to completion.  Encouraged patient increase daily water intake to 64 ounces per day while taking this medication.  Advised if symptoms worsen and/or unresolved please follow-up with PCP or here for further evaluation.  Patient discharged home, hemodynamically stable. Final Clinical Impressions(s) / UC Diagnoses   Final diagnoses:  Allergic reaction to drug, initial encounter     Discharge Instructions      Advised patient to take medication as directed with food to completion.  Encouraged patient increase daily water intake to 64 ounces  per day while taking this medication.  Advised if symptoms worsen and/or unresolved please follow-up with PCP or here for further evaluation.     ED Prescriptions     Medication Sig Dispense Auth. Provider   predniSONE (STERAPRED UNI-PAK 21 TAB) 10 MG (21) TBPK tablet Take by mouth daily. Take 6 tabs by mouth daily  for 2 days, then 5 tabs for 2 days, then 4 tabs for 2 days, then 3 tabs for 2 days, 2 tabs for 2 days, then 1 tab by mouth daily for 2 days 42 tablet Eliezer Lofts, FNP      PDMP not reviewed this encounter.   Eliezer Lofts, FNP 08/10/22 1424

## 2022-08-11 ENCOUNTER — Telehealth: Payer: Self-pay

## 2022-08-11 NOTE — Telephone Encounter (Signed)
Pt feeling better since UC visit. Started prednisone last night. Advised to call if any questions or concerns.

## 2022-09-05 ENCOUNTER — Ambulatory Visit: Payer: Medicaid Other | Admitting: Obstetrics and Gynecology

## 2022-10-13 ENCOUNTER — Encounter: Payer: Self-pay | Admitting: *Deleted

## 2022-10-13 ENCOUNTER — Encounter: Payer: Self-pay | Admitting: Obstetrics and Gynecology

## 2022-10-31 ENCOUNTER — Other Ambulatory Visit: Payer: Self-pay

## 2022-11-18 ENCOUNTER — Ambulatory Visit: Payer: Medicaid Other | Admitting: Obstetrics and Gynecology

## 2023-01-27 ENCOUNTER — Ambulatory Visit: Payer: Medicare PPO | Admitting: Obstetrics and Gynecology

## 2023-02-16 ENCOUNTER — Other Ambulatory Visit: Payer: Self-pay | Admitting: *Deleted

## 2023-02-16 DIAGNOSIS — N6310 Unspecified lump in the right breast, unspecified quadrant: Secondary | ICD-10-CM

## 2023-02-20 ENCOUNTER — Encounter: Payer: Self-pay | Admitting: *Deleted

## 2023-02-20 NOTE — Progress Notes (Unsigned)
Pt called requesting a diagnostic mammogram because [facility] had called her to schedule her annual mammogram when she told them she was having issues, they said she needed an order for diagnostic.  The following is the message pt send in my cart: The [facility] place called me to set up my mammogram. I feel a couple of new lumps and have a very slow healing lesion on my right breast. They told me that I needed to get you guys to order a diagnostic mammogram. They said that's the only way they can do it.  Also I had to cancel my annual with you earlier this month and I need someone to call and get me scheduled  Thank you! Judy Welch

## 2023-03-13 ENCOUNTER — Other Ambulatory Visit: Payer: Self-pay | Admitting: Obstetrics and Gynecology

## 2023-03-13 DIAGNOSIS — N6310 Unspecified lump in the right breast, unspecified quadrant: Secondary | ICD-10-CM

## 2023-03-31 ENCOUNTER — Encounter: Payer: Self-pay | Admitting: Obstetrics and Gynecology

## 2023-03-31 ENCOUNTER — Ambulatory Visit (INDEPENDENT_AMBULATORY_CARE_PROVIDER_SITE_OTHER): Payer: Medicare PPO | Admitting: Obstetrics and Gynecology

## 2023-03-31 VITALS — BP 124/82 | HR 74 | Ht 68.0 in | Wt 251.0 lb

## 2023-03-31 DIAGNOSIS — Z01419 Encounter for gynecological examination (general) (routine) without abnormal findings: Secondary | ICD-10-CM | POA: Diagnosis not present

## 2023-03-31 NOTE — Progress Notes (Signed)
GYNECOLOGY ANNUAL PREVENTATIVE CARE ENCOUNTER NOTE  History:     Judy Welch is a 47 y.o. G0P0000 female here for a routine annual gynecologic exam.  Current complaints: None. Recent foot surgery; recovering.    Denies abnormal vaginal bleeding, discharge, pelvic pain, problems with intercourse or other gynecologic concerns.   Diagnosed with gastritis recenlty  Gynecologic History Patient's last menstrual period was 02/23/2022. Contraception: status post hysterectomy Last Pap: 2022. Result was normal with negative HPV Last Mammogram: scheduled 04/01/2023, last one 02/14/22 Last Colonoscopy: 11/13/2022.  Result was abnormal; 2 kinds of polyps.   Obstetric History OB History  Gravida Para Term Preterm AB Living  0 0 0 0 0 0  SAB IAB Ectopic Multiple Live Births  0 0 0 0 0    Past Medical History:  Diagnosis Date   Alcohol abuse    pt reports "quit dirnking 08/29/2016 with brief relapse in April 2023"- no longer drinks   Anemia    Anxiety    Atrial fibrillation (HCC)    Breast disorder    left breast lump   Depression    Endocarditis    Fibrillation, atrial (HCC)    Heart murmur    pt reports being told she had a heart murmur in the past   Insomnia disorder    Mental disorder    anxiety   Ovarian cyst    Pneumonia    July 2022   PTSD (post-traumatic stress disorder)    Pulmonary embolism (HCC)    2020   Septic arthritis (HCC)    right hip   Septic embolism (HCC)    2020   Septic shock (HCC)    SVT (supraventricular tachycardia)    Tricuspid valve disorder    Vaginal Pap smear, abnormal     Past Surgical History:  Procedure Laterality Date   CARDIAC ELECTROPHYSIOLOGY MAPPING AND ABLATION     CARDIAC ELECTROPHYSIOLOGY STUDY AND ABLATION     CARPAL TUNNEL RELEASE Right    CHOLECYSTECTOMY     HIP SURGERY     THORACENTESIS Left    TOTAL HIP ARTHROPLASTY Right    TRICUSPID VALVE REPLACEMENT     VAGINAL HYSTERECTOMY Bilateral 03/04/2022   Procedure:  HYSTERECTOMY VAGINAL WITH SALPINGECTOMY;  Surgeon: Reva Bores, MD;  Location: [facility] OR;  Service: Gynecology;  Laterality: Bilateral;    Current Outpatient Medications on File Prior to Visit  Medication Sig Dispense Refill   buPROPion (WELLBUTRIN XL) 150 MG 24 hr tablet Take 150 mg by mouth See admin instructions. Tale with 300 mg for a total of 450 mg in the morning     buPROPion (WELLBUTRIN XL) 300 MG 24 hr tablet Take 300 mg by mouth See admin instructions. Take with 150 mg for a total of 450 mg in the morning     cyclobenzaprine (FLEXERIL) 10 MG tablet Take 10 mg by mouth 3 (three) times daily as needed for muscle spasms.     docusate sodium (COLACE) 100 MG capsule Take 1 capsule (100 mg total) by mouth 2 (two) times daily as needed. (Patient taking differently: Take 100 mg by mouth 2 (two) times daily.) 30 capsule 2   dofetilide (TIKOSYN) 250 MCG capsule Take 250 mcg by mouth 2 (two) times daily.     fluticasone (FLONASE) 50 MCG/ACT nasal spray Place 2 sprays into both nostrils daily as needed for allergies.     furosemide (LASIX) 20 MG tablet Take 1 tablet (20 mg total) by mouth as needed.  30 tablet    LORazepam (ATIVAN) 1 MG tablet Take 1 mg by mouth 4 (four) times daily as needed for anxiety (Panic attack).     potassium chloride (MICRO-K) 10 MEQ CR capsule Take 1 capsule (10 mEq total) by mouth as needed.     pregabalin (LYRICA) 200 MG capsule Take 1 capsule (200 mg total) by mouth in the morning, at noon, and at bedtime.     Pyridoxine HCl (VITAMIN B-6 CR) 200 MG TBCR Take 200 mg by mouth daily.     rivaroxaban (XARELTO) 20 MG TABS tablet Take 1 tablet (20 mg total) by mouth daily with supper. 30 tablet    rosuvastatin (CRESTOR) 10 MG tablet Take 10 mg by mouth every morning.     sertraline (ZOLOFT) 100 MG tablet Take 2 tablets (200 mg total) by mouth daily.     triamcinolone (KENALOG) 0.1 % paste Use as directed 1 Application in the mouth or throat 2 (two) times daily. 5 g 0    zolpidem (AMBIEN) 10 MG tablet Take 10 mg by mouth at bedtime as needed for sleep.     No current facility-administered medications on file prior to visit.    Allergies  Allergen Reactions   Sulfa Antibiotics Other (See Comments)    Can't take with DOFETILIDE/ TIKOSYN    Sulfamethoxazole-Trimethoprim Other (See Comments)    Can't take with DOFETILIDE/ TIKOSYN    Social History:  reports that she has never smoked. She has never used smokeless tobacco. She reports that she does not currently use alcohol. She reports that she does not use drugs.  Family History  Problem Relation Age of Onset   Diabetes Father    Hypertension Father    Cancer Mother        bladder   Cancer Maternal Aunt        breast   Stroke Neg Hx     The following portions of the patient's history were reviewed and updated as appropriate: allergies, current medications, past family history, past medical history, past social history, past surgical history and problem list.  Review of Systems Pertinent items noted in HPI and remainder of comprehensive ROS otherwise negative.  Physical Exam:  BP 124/82   Pulse 74   Ht 5\' 8"  (1.727 m)   Wt 251 lb (113.9 kg)   LMP 02/23/2022   BMI 38.16 kg/m  CONSTITUTIONAL: Well-developed, well-nourished female in no acute distress.  HENT:  Normocephalic, atraumatic, External right and left ear normal.  EYES: Conjunctivae and EOM are normal. Pupils are equal, round, and reactive to light. No scleral icterus.  NECK: Normal range of motion, supple, no masses.  Normal thyroid.  SKIN: Skin is warm and dry. No rash noted. Not diaphoretic. No erythema. No pallor. MUSCULOSKELETAL: Normal range of motion. No tenderness.  No cyanosis, clubbing, or edema. NEUROLOGIC: Alert and oriented to person, place, and time. Normal reflexes, muscle tone coordination.  PSYCHIATRIC: Normal mood and affect. Normal behavior. Normal judgment and thought content. CARDIOVASCULAR: Normal heart rate  noted, regular rhythm RESPIRATORY: Clear to auscultation bilaterally. Effort and breath sounds normal, no problems with respiration noted. BREASTS: Symmetric in size. No masses, tenderness, skin changes, nipple drainage, or lymphadenopathy bilaterally. Performed in the presence of a chaperone. ABDOMEN: Soft, no distention noted.  No tenderness, rebound or guarding.  PELVIC: Normal appearing external genitalia and urethral meatus, Uterus and cervix surgically removed.  Performed in the presence of a chaperone.   Assessment and Plan:   1. Women's  annual routine gynecological examination      Will follow up results of pap smear and manage accordingly. Normal breast examination today, she was advised to perform periodic self breast examinations.  Mammogram scheduled for breast cancer screening.  Routine preventative health maintenance measures emphasized. Please refer to After Visit Summary for other counseling recommendations.      Biochemist, clinical for [employer], [facility]

## 2023-04-08 ENCOUNTER — Encounter: Payer: Self-pay | Admitting: *Deleted

## 2023-04-08 NOTE — Progress Notes (Signed)
Diagnostic mammogram and U/S scanned and routed to J Rasch,NP

## 2023-07-17 ENCOUNTER — Other Ambulatory Visit: Payer: Medicare PPO

## 2023-08-06 ENCOUNTER — Ambulatory Visit
Admission: RE | Admit: 2023-08-06 | Discharge: 2023-08-06 | Disposition: A | Discharge status: Home / Self Care | Payer: Medicare PPO | Source: Ambulatory Visit | Attending: Obstetrics and Gynecology | Admitting: Obstetrics and Gynecology

## 2023-08-06 DIAGNOSIS — N6310 Unspecified lump in the right breast, unspecified quadrant: Secondary | ICD-10-CM

## 2024-06-21 ENCOUNTER — Encounter: Payer: Self-pay | Admitting: Obstetrics and Gynecology

## 2024-06-21 ENCOUNTER — Ambulatory Visit: Admitting: Obstetrics and Gynecology

## 2024-06-21 VITALS — BP 115/79 | HR 69 | Ht 68.0 in | Wt 236.0 lb

## 2024-06-21 DIAGNOSIS — Z01419 Encounter for gynecological examination (general) (routine) without abnormal findings: Secondary | ICD-10-CM

## 2024-06-21 NOTE — Progress Notes (Signed)
 GYNECOLOGY ANNUAL PREVENTATIVE CARE ENCOUNTER NOTE  History:     Judy Welch is a 48 y.o. G0P0000 female here for a routine annual gynecologic exam.  Current complaints: None.   Denies abnormal vaginal bleeding, discharge, pelvic pain, problems with intercourse or other gynecologic concerns.   Has been dieting, on wegovy and has lost 20 lbs.    Gynecologic History Patient's last menstrual period was 02/23/2022. Contraception: status post hysterectomyJuly 2023 due to Menorrhagia on xerolto.  Pathology shows Negative for SIL and glandular neoplasia in the uterine cervix.  Last Pap: 2022. Result was normal with negative HPV Last Mammogram: 2024.  Result was normal Last Colonoscopy: 2024.  Result was normal with Polyps.   Obstetric History OB History  Gravida Para Term Preterm AB Living  0 0 0 0 0 0  SAB IAB Ectopic Multiple Live Births  0 0 0 0 0    Past Medical History:  Diagnosis Date   Alcohol abuse    pt reports quit dirnking 08/29/2016 with brief relapse in April 2023- no longer drinks   Anemia    Anxiety    Atrial fibrillation (HCC)    Breast disorder    left breast lump   Depression    Endocarditis    Fibrillation, atrial (HCC)    Heart murmur    pt reports being told she had a heart murmur in the past   Insomnia disorder    Mental disorder    anxiety   Ovarian cyst    Pneumonia    July 2022   PTSD (post-traumatic stress disorder)    Pulmonary embolism (HCC)    2020   Septic arthritis (HCC)    right hip   Septic embolism (HCC)    2020   Septic shock (HCC)    SVT (supraventricular tachycardia)    Tricuspid valve disorder    Vaginal Pap smear, abnormal     Past Surgical History:  Procedure Laterality Date   CARDIAC ELECTROPHYSIOLOGY MAPPING AND ABLATION     CARDIAC ELECTROPHYSIOLOGY STUDY AND ABLATION     CARPAL TUNNEL RELEASE Right    CHOLECYSTECTOMY     HIP SURGERY     THORACENTESIS Left    TOTAL HIP ARTHROPLASTY Right    TRICUSPID VALVE  REPLACEMENT     VAGINAL HYSTERECTOMY Bilateral 03/04/2022   Procedure: HYSTERECTOMY VAGINAL WITH SALPINGECTOMY;  Surgeon: Fredirick Glenys RAMAN, MD;  Location: [facility] OR;  Service: Gynecology;  Laterality: Bilateral;    Current Outpatient Medications on File Prior to Visit  Medication Sig Dispense Refill   buPROPion  (WELLBUTRIN  XL) 150 MG 24 hr tablet Take 150 mg by mouth See admin instructions. Tale with 300 mg for a total of 450 mg in the morning     buPROPion  (WELLBUTRIN  XL) 300 MG 24 hr tablet Take 300 mg by mouth See admin instructions. Take with 150 mg for a total of 450 mg in the morning     cyclobenzaprine  (FLEXERIL ) 10 MG tablet Take 10 mg by mouth 3 (three) times daily as needed for muscle spasms.     docusate sodium  (COLACE) 100 MG capsule Take 1 capsule (100 mg total) by mouth 2 (two) times daily as needed. 30 capsule 2   dofetilide  (TIKOSYN ) 250 MCG capsule Take 250 mcg by mouth 2 (two) times daily.     fluticasone  (FLONASE ) 50 MCG/ACT nasal spray Place 2 sprays into both nostrils daily as needed for allergies.     furosemide  (LASIX ) 20 MG tablet Take  1 tablet (20 mg total) by mouth as needed. 30 tablet    LORazepam  (ATIVAN ) 1 MG tablet Take 1 mg by mouth 4 (four) times daily as needed for anxiety (Panic attack).     metoprolol succinate (TOPROL-XL) 50 MG 24 hr tablet Take 50 mg by mouth daily. Take with or immediately following a meal.     potassium chloride  (MICRO-K ) 10 MEQ CR capsule Take 1 capsule (10 mEq total) by mouth as needed.     pregabalin  (LYRICA ) 200 MG capsule Take 1 capsule (200 mg total) by mouth in the morning, at noon, and at bedtime.     Pyridoxine HCl (VITAMIN B-6 CR) 200 MG TBCR Take 200 mg by mouth daily.     rivaroxaban  (XARELTO ) 20 MG TABS tablet Take 1 tablet (20 mg total) by mouth daily with supper. 30 tablet    rosuvastatin  (CRESTOR ) 10 MG tablet Take 10 mg by mouth every morning.     semaglutide-weight management (WEGOVY) 2.4 MG/0.75ML SOAJ SQ injection Inject 2.4 mg  into the skin.     sertraline  (ZOLOFT ) 100 MG tablet Take 2 tablets (200 mg total) by mouth daily.     triamcinolone  (KENALOG ) 0.1 % paste Use as directed 1 Application in the mouth or throat 2 (two) times daily. 5 g 0   zolpidem  (AMBIEN ) 10 MG tablet Take 10 mg by mouth at bedtime as needed for sleep.     No current facility-administered medications on file prior to visit.    Allergies  Allergen Reactions   Sulfa Antibiotics Other (See Comments)    Can't take with DOFETILIDE / TIKOSYN     Sulfamethoxazole-Trimethoprim Other (See Comments)    Can't take with DOFETILIDE / TIKOSYN     Social History:  reports that she has never smoked. She has never used smokeless tobacco. She reports that she does not currently use alcohol. She reports that she does not use drugs.  Family History  Problem Relation Age of Onset   Diabetes Father    Hypertension Father    Cancer Mother        bladder   Cancer Maternal Aunt        breast   Stroke Neg Hx     The following portions of the patient's history were reviewed and updated as appropriate: allergies, current medications, past family history, past medical history, past social history, past surgical history and problem list.  Review of Systems Pertinent items noted in HPI and remainder of comprehensive ROS otherwise negative.  Physical Exam:  BP 115/79   Pulse 69   Ht 5' 8 (1.727 m)   Wt 236 lb (107 kg)   LMP 02/23/2022   BMI 35.88 kg/m  CONSTITUTIONAL: Well-developed, well-nourished female in no acute distress.  HENT:  Normocephalic, atraumatic, External right and left ear normal.  EYES: Conjunctivae and EOM are normal. Pupils are equal, round, and reactive to light. No scleral icterus.  NECK: Normal range of motion, supple, no masses.  Normal thyroid.  SKIN: Skin is warm and dry. No rash noted. Not diaphoretic. No erythema. No pallor. MUSCULOSKELETAL: Normal range of motion. No tenderness.  No cyanosis, clubbing, or edema. NEUROLOGIC:  Alert and oriented to person, place, and time. Normal reflexes, muscle tone coordination.  PSYCHIATRIC: Normal mood and affect. Normal behavior. Normal judgment and thought content. CARDIOVASCULAR: Normal heart rate noted, regular rhythm RESPIRATORY: Clear to auscultation bilaterally. Effort and breath sounds normal, no problems with respiration noted. BREASTS: Symmetric in size. No masses, tenderness, skin changes, nipple drainage,  or lymphadenopathy bilaterally. Performed in the presence of a chaperone. ABDOMEN: Soft, no distention noted.  No tenderness, rebound or guarding.  PELVIC: Deferred     1. Women's annual routine gynecological examination (Primary)  - MM 3D SCREENING MAMMOGRAM BILATERAL BREAST; Future   Normal breast examination today, she was advised to perform periodic self breast examinations.  Mammogram scheduled for breast cancer screening. Colon cancer screening is up to date Routine preventative health maintenance measures emphasized. Please refer to After Visit Summary for other counseling recommendations.     Roselyn Doby, Delon FERNS, NP [facility] [employer], [facility]
